# Patient Record
Sex: Female | Born: 1945
Health system: Southern US, Community
[De-identification: ages and names within clinical notes are randomized; demographics above are authoritative.]

## PROBLEM LIST (undated history)

## (undated) DIAGNOSIS — Z8639 Personal history of other endocrine, nutritional and metabolic disease: Secondary | ICD-10-CM

## (undated) DIAGNOSIS — M19049 Primary osteoarthritis, unspecified hand: Secondary | ICD-10-CM

## (undated) DIAGNOSIS — K5790 Diverticulosis of intestine, part unspecified, without perforation or abscess without bleeding: Secondary | ICD-10-CM

## (undated) DIAGNOSIS — Z90710 Acquired absence of both cervix and uterus: Secondary | ICD-10-CM

## (undated) DIAGNOSIS — E2839 Other primary ovarian failure: Secondary | ICD-10-CM

## (undated) DIAGNOSIS — Z8739 Personal history of other diseases of the musculoskeletal system and connective tissue: Secondary | ICD-10-CM

## (undated) DIAGNOSIS — L821 Other seborrheic keratosis: Secondary | ICD-10-CM

## (undated) DIAGNOSIS — F419 Anxiety disorder, unspecified: Secondary | ICD-10-CM

## (undated) DIAGNOSIS — I10 Essential (primary) hypertension: Secondary | ICD-10-CM

## (undated) DIAGNOSIS — D179 Benign lipomatous neoplasm, unspecified: Secondary | ICD-10-CM

## (undated) DIAGNOSIS — E559 Vitamin D deficiency, unspecified: Secondary | ICD-10-CM

## (undated) DIAGNOSIS — E785 Hyperlipidemia, unspecified: Secondary | ICD-10-CM

## (undated) DIAGNOSIS — J45909 Unspecified asthma, uncomplicated: Secondary | ICD-10-CM

## (undated) DIAGNOSIS — J309 Allergic rhinitis, unspecified: Secondary | ICD-10-CM

## (undated) DIAGNOSIS — R5383 Other fatigue: Secondary | ICD-10-CM

## (undated) HISTORY — DX: Other fatigue: R53.83

## (undated) HISTORY — DX: Personal history of other endocrine, nutritional and metabolic disease: Z86.39

## (undated) HISTORY — DX: Primary osteoarthritis, unspecified hand: M19.049

## (undated) HISTORY — DX: Unspecified asthma, uncomplicated: J45.909

## (undated) HISTORY — DX: Other seborrheic keratosis: L82.1

## (undated) HISTORY — DX: Vitamin D deficiency, unspecified: E55.9

## (undated) HISTORY — DX: Hyperlipidemia, unspecified: E78.5

## (undated) HISTORY — DX: Other primary ovarian failure: E28.39

## (undated) HISTORY — DX: Essential (primary) hypertension: I10

## (undated) HISTORY — DX: Personal history of other diseases of the musculoskeletal system and connective tissue: Z87.39

## (undated) HISTORY — DX: Diverticulosis of intestine, part unspecified, without perforation or abscess without bleeding: K57.90

## (undated) HISTORY — DX: Acquired absence of both cervix and uterus: Z90.710

## (undated) HISTORY — DX: Benign lipomatous neoplasm, unspecified: D17.9

## (undated) HISTORY — DX: Allergic rhinitis, unspecified: J30.9

## (undated) HISTORY — DX: Anxiety disorder, unspecified: F41.9

---

## 1977-07-26 HISTORY — PX: TOTAL ABDOMINAL HYSTERECTOMY: SHX209

## 2018-02-10 ENCOUNTER — Encounter: Payer: Self-pay | Admitting: *Deleted

## 2018-02-13 ENCOUNTER — Other Ambulatory Visit: Payer: Self-pay | Admitting: Cardiology

## 2018-02-13 ENCOUNTER — Encounter: Payer: Self-pay | Admitting: *Deleted

## 2018-02-13 ENCOUNTER — Telehealth: Payer: Self-pay | Admitting: Cardiology

## 2018-02-13 ENCOUNTER — Ambulatory Visit: Payer: Medicare Other | Admitting: Cardiology

## 2018-02-13 ENCOUNTER — Encounter: Payer: Self-pay | Admitting: Cardiology

## 2018-02-13 VITALS — BP 111/62 | HR 60 | Ht 63.5 in | Wt 140.6 lb

## 2018-02-13 DIAGNOSIS — I5022 Chronic systolic (congestive) heart failure: Secondary | ICD-10-CM

## 2018-02-13 DIAGNOSIS — I447 Left bundle-branch block, unspecified: Secondary | ICD-10-CM

## 2018-02-13 DIAGNOSIS — I10 Essential (primary) hypertension: Secondary | ICD-10-CM | POA: Insufficient documentation

## 2018-02-13 MED ORDER — SACUBITRIL-VALSARTAN 49-51 MG PO TABS: 1.0000 | ORAL_TABLET | Freq: Two times a day (BID) | ORAL | 3 refills | Status: DC

## 2018-02-13 MED ORDER — METOPROLOL SUCCINATE ER 25 MG PO TB24: 37.5000 mg | ORAL_TABLET | Freq: Every day | ORAL | 3 refills | Status: DC

## 2018-02-13 NOTE — H&P (View-Only) (Signed)
Clinical Summary Kathleen Edwards is a 72 y.o.female seen as new consult, referred by Dr Woody Seller for new onset systolic HF.    1. CHF - admitted 01/17/18 with new diagnosis of systolic HF.  - from notes presented with hypertensive emergency, started on nitro gtt and diuresed - EKG LBBB,  - 12/2017 echo [facility]: LVEF 20%  - compliant with meds - no recent edema - no recent chest pain, no SOB - home weight stable 140 lbs.       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 Acquired absence of both cervix and uterus   . Acute upper respiratory infection   . Anxiety disorder   . Asthma   . Degenerative joint disease of hand   . Diverticulosis   . Fatigue   . H/O: osteoarthritis   . History of vitamin A deficiency   . Hyperlipidemia   . Hypertension   . Keratosis, seborrheic   . Lipoma   . Ovarian failure   . Rhinitis, allergic   . Vitamin D deficiency      Allergies  Allergen Reactions  . Lisinopril     Cough   . Vioxx [Rofecoxib]     Epigastric discomfort      Current Outpatient Medications  Medication Sig Dispense Refill  . Calcium Carb-Cholecalciferol (CALCIUM 600 + D PO) Take by mouth.    . furosemide (LASIX) 20 MG tablet Take 20 mg by mouth daily.    Marland Kitchen losartan (COZAAR) 100 MG tablet Take 100 mg by mouth daily.    . metoprolol tartrate (LOPRESSOR) 25 MG tablet Take 25 mg by mouth 2 (two) times daily.    . potassium chloride (K-DUR) 10 MEQ tablet Take 10 mEq by mouth daily.    . pravastatin (PRAVACHOL) 40 MG tablet Take 40 mg by mouth daily.    . vitamin B-12 (CYANOCOBALAMIN) 1000 MCG tablet Take 1,000 mcg by mouth daily.     No current facility-administered medications for this visit.         Allergies  Allergen Reactions  . Lisinopril     Cough   . Vioxx [Rofecoxib]     Epigastric discomfort       Family History  Problem Relation Age of Onset  . CVA Mother   . Brain cancer Father       Social History Kathleen Edwards has no tobacco history on file. Kathleen Edwards has no alcohol history on file.   Review of Systems CONSTITUTIONAL: No weight loss, fever, chills, weakness or fatigue.  HEENT: Eyes: No visual loss, blurred vision, double vision or yellow sclerae.No hearing loss, sneezing, congestion, runny nose or sore throat.  SKIN: No rash or itching.  CARDIOVASCULAR: per hpi RESPIRATORY: per hpi.  GASTROINTESTINAL: No anorexia, nausea, vomiting or diarrhea. No abdominal pain or blood.  GENITOURINARY: No burning on urination, no polyuria NEUROLOGICAL: No headache, dizziness, syncope, paralysis, ataxia, numbness or tingling in the extremities. No change in bowel or bladder control.  MUSCULOSKELETAL: No muscle, back pain, joint pain or stiffness.  LYMPHATICS: No enlarged nodes. No history of splenectomy.  PSYCHIATRIC: No history of depression or anxiety.  ENDOCRINOLOGIC: No reports of sweating, cold or heat intolerance. No polyuria or polydipsia.  Marland Kitchen   Physical Examination Vitals:   02/13/18 0831 02/13/18 0837  BP: 111/68 111/62  Pulse: 78 60  SpO2: 98% 97%   Vitals:   02/13/18 0831  Weight: 140 lb  9.6 oz (63.8 kg)  Height: 5' 3.5" (1.613 m)    Gen: resting comfortably, no acute distress HEENT: no scleral icterus, pupils equal round and reactive, no palptable cervical adenopathy,  CV: RRR, no m/r/g, no jvd Resp: Clear to auscultation bilaterally GI: abdomen is soft, non-tender, non-distended, normal bowel sounds, no hepatosplenomegaly MSK: extremities are warm, no edema.  Skin: warm, no rash Neuro:  no focal deficits Psych: appropriate affect      Assessment and Plan  1 Chronic systolic HF - new diagnosis last month, LVEF 20% by echo at [facility]. NYHA II/III - we will change lopressor to TOprol XL 37.5mg  daily. Stop losartan, start entresto 49/51mg  bid - plan for RHC/LHC to evaluate for possible ischemic cardiomyopathy  2. LBBB - unknown chronicity -  in setting of systolic HF increased concern for ICM - f/u cath  I have reviewed the risks, indications, and alternatives to cardiac catheterization, possible angioplasty, and stenting with the patient and her daughter today. Risks include but are not limited to bleeding, infection, vascular injury, stroke, myocardial infection, arrhythmia, kidney injury, radiation-related injury in the case of prolonged fluoroscopy use, emergency cardiac surgery, and death. The patient understands the risks of serious complication is 1-2 in 3009 with diagnostic cardiac cath and 1-2% or less with angioplasty/stenting.   F/u 3 weeks    Arnoldo Lenis, M.D.

## 2018-02-13 NOTE — Patient Instructions (Addendum)
Your physician recommends that you schedule a follow-up appointment in: [place] has recommended you make the following change in your medication:   STOP LOPRESSOR   STOP LOSARTAN  TART TOPROL XL 37.5 MG (1 AND 1/2 TABLETS) DAILY  START ENTRESTO 48/51 MG TWICE DAILY  Your physician has requested that you have a cardiac catheterization. Cardiac catheterization is used to diagnose and/or treat various heart conditions. Doctors may recommend this procedure for a number of different reasons. The most common reason is to evaluate chest pain. Chest pain can be a symptom of coronary artery disease (CAD), and cardiac catheterization can show whether plaque is narrowing or blocking your heart's arteries. This procedure is also used to evaluate the valves, as well as measure the blood flow and oxygen levels in different parts of your heart. For further information please visit [email or web address]. Please follow instruction sheet, as given.  Thank you for choosing [facility]!!

## 2018-02-13 NOTE — Progress Notes (Signed)
Clinical Summary Kathleen Edwards is a 72 y.o.female seen as new consult, referred by Dr Woody Seller for new onset systolic HF.    1. CHF - admitted 01/17/18 with new diagnosis of systolic HF.  - from notes presented with hypertensive emergency, started on nitro gtt and diuresed - EKG LBBB,  - 12/2017 echo [facility]: LVEF 20%  - compliant with meds - no recent edema - no recent chest pain, no SOB - home weight stable 140 lbs.       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 Acquired absence of both cervix and uterus   . Acute upper respiratory infection   . Anxiety disorder   . Asthma   . Degenerative joint disease of hand   . Diverticulosis   . Fatigue   . H/O: osteoarthritis   . History of vitamin A deficiency   . Hyperlipidemia   . Hypertension   . Keratosis, seborrheic   . Lipoma   . Ovarian failure   . Rhinitis, allergic   . Vitamin D deficiency      Allergies  Allergen Reactions  . Lisinopril     Cough   . Vioxx [Rofecoxib]     Epigastric discomfort      Current Outpatient Medications  Medication Sig Dispense Refill  . Calcium Carb-Cholecalciferol (CALCIUM 600 + D PO) Take by mouth.    . furosemide (LASIX) 20 MG tablet Take 20 mg by mouth daily.    Marland Kitchen losartan (COZAAR) 100 MG tablet Take 100 mg by mouth daily.    . metoprolol tartrate (LOPRESSOR) 25 MG tablet Take 25 mg by mouth 2 (two) times daily.    . potassium chloride (K-DUR) 10 MEQ tablet Take 10 mEq by mouth daily.    . pravastatin (PRAVACHOL) 40 MG tablet Take 40 mg by mouth daily.    . vitamin B-12 (CYANOCOBALAMIN) 1000 MCG tablet Take 1,000 mcg by mouth daily.     No current facility-administered medications for this visit.         Allergies  Allergen Reactions  . Lisinopril     Cough   . Vioxx [Rofecoxib]     Epigastric discomfort       Family History  Problem Relation Age of Onset  . CVA Mother   . Brain cancer Father       Social History Ms. Pain has no tobacco history on file. Ms. Goya has no alcohol history on file.   Review of Systems CONSTITUTIONAL: No weight loss, fever, chills, weakness or fatigue.  HEENT: Eyes: No visual loss, blurred vision, double vision or yellow sclerae.No hearing loss, sneezing, congestion, runny nose or sore throat.  SKIN: No rash or itching.  CARDIOVASCULAR: per hpi RESPIRATORY: per hpi.  GASTROINTESTINAL: No anorexia, nausea, vomiting or diarrhea. No abdominal pain or blood.  GENITOURINARY: No burning on urination, no polyuria NEUROLOGICAL: No headache, dizziness, syncope, paralysis, ataxia, numbness or tingling in the extremities. No change in bowel or bladder control.  MUSCULOSKELETAL: No muscle, back pain, joint pain or stiffness.  LYMPHATICS: No enlarged nodes. No history of splenectomy.  PSYCHIATRIC: No history of depression or anxiety.  ENDOCRINOLOGIC: No reports of sweating, cold or heat intolerance. No polyuria or polydipsia.  Marland Kitchen   Physical Examination Vitals:   02/13/18 0831 02/13/18 0837  BP: 111/68 111/62  Pulse: 78 60  SpO2: 98% 97%   Vitals:   02/13/18 0831  Weight: 140 lb  9.6 oz (63.8 kg)  Height: 5' 3.5" (1.613 m)    Gen: resting comfortably, no acute distress HEENT: no scleral icterus, pupils equal round and reactive, no palptable cervical adenopathy,  CV: RRR, no m/r/g, no jvd Resp: Clear to auscultation bilaterally GI: abdomen is soft, non-tender, non-distended, normal bowel sounds, no hepatosplenomegaly MSK: extremities are warm, no edema.  Skin: warm, no rash Neuro:  no focal deficits Psych: appropriate affect      Assessment and Plan  1 Chronic systolic HF - new diagnosis last month, LVEF 20% by echo at [facility]. NYHA II/III - we will change lopressor to TOprol XL 37.5mg  daily. Stop losartan, start entresto 49/51mg  bid - plan for RHC/LHC to evaluate for possible ischemic cardiomyopathy  2. LBBB - unknown chronicity -  in setting of systolic HF increased concern for ICM - f/u cath  I have reviewed the risks, indications, and alternatives to cardiac catheterization, possible angioplasty, and stenting with the patient and her daughter today. Risks include but are not limited to bleeding, infection, vascular injury, stroke, myocardial infection, arrhythmia, kidney injury, radiation-related injury in the case of prolonged fluoroscopy use, emergency cardiac surgery, and death. The patient understands the risks of serious complication is 1-2 in 1610 with diagnostic cardiac cath and 1-2% or less with angioplasty/stenting.   F/u 3 weeks    Arnoldo Lenis, M.D.

## 2018-02-13 NOTE — Telephone Encounter (Signed)
Pre-cert Verification for the following procedure   CATH 7/24 @ 8:30 DR Claiborne Billings

## 2018-02-14 ENCOUNTER — Telehealth: Payer: Self-pay | Admitting: *Deleted

## 2018-02-14 ENCOUNTER — Encounter: Payer: Self-pay | Admitting: *Deleted

## 2018-02-14 NOTE — Telephone Encounter (Signed)
Pt contacted pre-catheterization scheduled at [facility] for: Wednesday February 15, 2018 8:30 AM Verified arrival time and place: [place] Entrance A at: 6:30 AM  No solid food after midnight prior to cath, clear liquids until 5 AM day of procedure. Verified allergies in Epic  Hold: HCTZ AM of procedure  KCL AM of procedure  Except hold medications AM meds can be  taken pre-cath with sip of water including: ASA 81 mg  Confirmed patient has responsible person to drive home post procedure and for 24 hours after you arrive home: yes

## 2018-02-15 ENCOUNTER — Encounter (HOSPITAL_COMMUNITY): Payer: Self-pay | Admitting: Cardiovascular Disease

## 2018-02-15 ENCOUNTER — Ambulatory Visit (HOSPITAL_COMMUNITY)
Admission: RE | Admit: 2018-02-15 | Discharge: 2018-02-15 | Disposition: A | Discharge status: Home / Self Care | Payer: Medicare Other | Source: Ambulatory Visit | Attending: Cardiovascular Disease | Admitting: Cardiovascular Disease

## 2018-02-15 ENCOUNTER — Encounter (HOSPITAL_COMMUNITY)
Admission: RE | Disposition: A | Discharge status: Home / Self Care | Payer: Self-pay | Source: Ambulatory Visit | Attending: Cardiovascular Disease

## 2018-02-15 DIAGNOSIS — J45909 Unspecified asthma, uncomplicated: Secondary | ICD-10-CM | POA: Insufficient documentation

## 2018-02-15 DIAGNOSIS — I110 Hypertensive heart disease with heart failure: Secondary | ICD-10-CM | POA: Diagnosis present

## 2018-02-15 DIAGNOSIS — I5022 Chronic systolic (congestive) heart failure: Secondary | ICD-10-CM | POA: Diagnosis not present

## 2018-02-15 DIAGNOSIS — E559 Vitamin D deficiency, unspecified: Secondary | ICD-10-CM | POA: Insufficient documentation

## 2018-02-15 DIAGNOSIS — I2510 Atherosclerotic heart disease of native coronary artery without angina pectoris: Secondary | ICD-10-CM

## 2018-02-15 DIAGNOSIS — E785 Hyperlipidemia, unspecified: Secondary | ICD-10-CM | POA: Insufficient documentation

## 2018-02-15 DIAGNOSIS — F419 Anxiety disorder, unspecified: Secondary | ICD-10-CM | POA: Insufficient documentation

## 2018-02-15 DIAGNOSIS — M19049 Primary osteoarthritis, unspecified hand: Secondary | ICD-10-CM | POA: Diagnosis not present

## 2018-02-15 DIAGNOSIS — I447 Left bundle-branch block, unspecified: Secondary | ICD-10-CM | POA: Diagnosis not present

## 2018-02-15 HISTORY — PX: RIGHT/LEFT HEART CATH AND CORONARY ANGIOGRAPHY: CATH118266

## 2018-02-15 LAB — POCT I-STAT 3, VENOUS BLOOD GAS (G3P V)
Acid-base deficit: 3 mmol/L — ABNORMAL HIGH (ref 0.0–2.0)
Acid-base deficit: 3 mmol/L — ABNORMAL HIGH (ref 0.0–2.0)
Bicarbonate: 22.2 mmol/L (ref 20.0–28.0)
Bicarbonate: 22.6 mmol/L (ref 20.0–28.0)
O2 Saturation: 67 %
O2 Saturation: 67 %
TCO2: 23 mmol/L (ref 22–32)
TCO2: 24 mmol/L (ref 22–32)
pCO2, Ven: 40.3 mmHg — ABNORMAL LOW (ref 44.0–60.0)
pCO2, Ven: 40.6 mmHg — ABNORMAL LOW (ref 44.0–60.0)
pH, Ven: 7.349 (ref 7.250–7.430)
pH, Ven: 7.353 (ref 7.250–7.430)
pO2, Ven: 36 mmHg (ref 32.0–45.0)
pO2, Ven: 36 mmHg (ref 32.0–45.0)

## 2018-02-15 LAB — POCT I-STAT 3, ART BLOOD GAS (G3+)
Acid-base deficit: 6 mmol/L — ABNORMAL HIGH (ref 0.0–2.0)
Bicarbonate: 19.9 mmol/L — ABNORMAL LOW (ref 20.0–28.0)
O2 Saturation: 92 %
TCO2: 21 mmol/L — ABNORMAL LOW (ref 22–32)
pCO2 arterial: 39.3 mmHg (ref 32.0–48.0)
pH, Arterial: 7.312 — ABNORMAL LOW (ref 7.350–7.450)
pO2, Arterial: 70 mmHg — ABNORMAL LOW (ref 83.0–108.0)

## 2018-02-15 SURGERY — RIGHT/LEFT HEART CATH AND CORONARY ANGIOGRAPHY
Anesthesia: LOCAL

## 2018-02-15 MED ORDER — HEPARIN SODIUM (PORCINE) 1000 UNIT/ML IJ SOLN
INTRAMUSCULAR | Status: DC | PRN
  Administered 2018-02-15: 3000 [IU] via INTRAVENOUS

## 2018-02-15 MED ORDER — FENTANYL CITRATE (PF) 100 MCG/2ML IJ SOLN
INTRAMUSCULAR | Status: DC | PRN
  Administered 2018-02-15: 25 ug via INTRAVENOUS

## 2018-02-15 MED ORDER — ASPIRIN 81 MG PO CHEW: 81.0000 mg | CHEWABLE_TABLET | ORAL | Status: DC

## 2018-02-15 MED ORDER — HEPARIN (PORCINE) IN NACL 2000-0.9 UNIT/L-% IV SOLN
INTRAVENOUS | Status: AC
  Filled 2018-02-15: qty 1000

## 2018-02-15 MED ORDER — FENTANYL CITRATE (PF) 100 MCG/2ML IJ SOLN
INTRAMUSCULAR | Status: AC
  Filled 2018-02-15: qty 2

## 2018-02-15 MED ORDER — IOPAMIDOL (ISOVUE-370) INJECTION 76%
INTRAVENOUS | Status: AC
  Filled 2018-02-15: qty 100

## 2018-02-15 MED ORDER — LIDOCAINE HCL (PF) 1 % IJ SOLN
INTRAMUSCULAR | Status: DC | PRN
  Administered 2018-02-15: 3 mL
  Administered 2018-02-15: 5 mL

## 2018-02-15 MED ORDER — HEPARIN SODIUM (PORCINE) 1000 UNIT/ML IJ SOLN
INTRAMUSCULAR | Status: AC
  Filled 2018-02-15: qty 1

## 2018-02-15 MED ORDER — SODIUM CHLORIDE 0.9% FLUSH: 3.0000 mL | INTRAVENOUS | Status: DC | PRN

## 2018-02-15 MED ORDER — SODIUM CHLORIDE 0.9 % IV SOLN
INTRAVENOUS | Status: DC
  Administered 2018-02-15: 07:00:00 via INTRAVENOUS

## 2018-02-15 MED ORDER — MIDAZOLAM HCL 2 MG/2ML IJ SOLN
INTRAMUSCULAR | Status: DC | PRN
  Administered 2018-02-15: 1 mg via INTRAVENOUS

## 2018-02-15 MED ORDER — VERAPAMIL HCL 2.5 MG/ML IV SOLN
INTRAVENOUS | Status: AC
  Filled 2018-02-15: qty 2

## 2018-02-15 MED ORDER — HEPARIN (PORCINE) IN NACL 2000-0.9 UNIT/L-% IV SOLN
INTRAVENOUS | Status: DC | PRN
  Administered 2018-02-15: 1000 mL

## 2018-02-15 MED ORDER — SODIUM CHLORIDE 0.9 % IV SOLN: 250.0000 mL | INTRAVENOUS | Status: DC | PRN

## 2018-02-15 MED ORDER — MIDAZOLAM HCL 2 MG/2ML IJ SOLN
INTRAMUSCULAR | Status: AC
  Filled 2018-02-15: qty 2

## 2018-02-15 MED ORDER — VERAPAMIL HCL 2.5 MG/ML IV SOLN
INTRAVENOUS | Status: DC | PRN
  Administered 2018-02-15: 10 mL via INTRA_ARTERIAL

## 2018-02-15 MED ORDER — LIDOCAINE HCL (PF) 1 % IJ SOLN
INTRAMUSCULAR | Status: AC
  Filled 2018-02-15: qty 30

## 2018-02-15 MED ORDER — SODIUM CHLORIDE 0.9% FLUSH: 3.0000 mL | Freq: Two times a day (BID) | INTRAVENOUS | Status: DC

## 2018-02-15 MED ORDER — IOPAMIDOL (ISOVUE-370) INJECTION 76%
INTRAVENOUS | Status: DC | PRN
  Administered 2018-02-15: 70 mL via INTRAVENOUS

## 2018-02-15 SURGICAL SUPPLY — 14 items
CATH 5FR JL3.5 JR4 ANG PIG MP (CATHETERS) IMPLANT
CATH BALLN WEDGE 5F 110CM (CATHETERS) ×2 IMPLANT
CATH OPTITORQUE TIG 4.0 5F (CATHETERS) ×2 IMPLANT
DEVICE RAD COMP TR BAND LRG (VASCULAR PRODUCTS) ×2 IMPLANT
GLIDESHEATH SLEND SS 6F .021 (SHEATH) ×2 IMPLANT
GUIDEWIRE .025 260CM (WIRE) ×2 IMPLANT
GUIDEWIRE INQWIRE 1.5J.035X260 (WIRE) ×1 IMPLANT
INQWIRE 1.5J .035X260CM (WIRE) ×2
KIT HEART LEFT (KITS) ×2 IMPLANT
PACK CARDIAC CATHETERIZATION (CUSTOM PROCEDURE TRAY) ×2 IMPLANT
SHEATH GLIDE SLENDER 4/5FR (SHEATH) ×2 IMPLANT
SHEATH PROBE COVER 6X72 (BAG) ×2 IMPLANT
TRANSDUCER W/STOPCOCK (MISCELLANEOUS) ×2 IMPLANT
TUBING CIL FLEX 10 FLL-RA (TUBING) ×2 IMPLANT

## 2018-02-15 NOTE — Discharge Instructions (Signed)
**Note Avanthika Dehnert-identified via Obfuscation** Radial Site Care °Refer to this sheet in the next few weeks. These instructions provide you with information about caring for yourself after your procedure. Your health care provider may also give you more specific instructions. Your treatment has been planned according to current medical practices, but problems sometimes occur. Call your health care provider if you have any problems or questions after your procedure. °What can I expect after the procedure? °After your procedure, it is typical to have the following: °· Bruising at the radial site that usually fades within 1-2 weeks. °· Blood collecting in the tissue (hematoma) that may be painful to the touch. It should usually decrease in size and tenderness within 1-2 weeks. ° °Follow these instructions at home: °· Take medicines only as directed by your health care provider. °· You may shower 24-48 hours after the procedure or as directed by your health care provider. Remove the bandage (dressing) and gently wash the site with plain soap and water. Pat the area dry with a clean towel. Do not rub the site, because this may cause bleeding. °· Do not take baths, swim, or use a hot tub until your health care provider approves. °· Check your insertion site every day for redness, swelling, or drainage. °· Do not apply powder or lotion to the site. °· Do not flex or bend the affected arm for 24 hours or as directed by your health care provider. °· Do not push or pull heavy objects with the affected arm for 24 hours or as directed by your health care provider. °· Do not lift over 10 lb (4.5 kg) for 5 days after your procedure or as directed by your health care provider. °· Ask your health care provider when it is okay to: °? Return to work or school. °? Resume usual physical activities or sports. °? Resume sexual activity. °· Do not drive home if you are discharged the same day as the procedure. Have someone else drive you. °· You may drive 24 hours after the procedure  unless otherwise instructed by your health care provider. °· Do not operate machinery or power tools for 24 hours after the procedure. °· If your procedure was done as an outpatient procedure, which means that you went home the same day as your procedure, a responsible adult should be with you for the first 24 hours after you arrive home. °· Keep all follow-up visits as directed by your health care provider. This is important. °Contact a health care provider if: °· You have a fever. °· You have chills. °· You have increased bleeding from the radial site. Hold pressure on the site. °Get help right away if: °· You have unusual pain at the radial site. °· You have redness, warmth, or swelling at the radial site. °· You have drainage (other than a small amount of blood on the dressing) from the radial site. °· The radial site is bleeding, and the bleeding does not stop after 30 minutes of holding steady pressure on the site. °· Your arm or hand becomes pale, cool, tingly, or numb. °This information is not intended to replace advice given to you by your health care provider. Make sure you discuss any questions you have with your health care provider. °Document Released: 08/14/2010 Document Revised: 12/18/2015 Document Reviewed: 01/28/2014 °Elsevier Interactive Patient Education © 2018 [employer] Inc[employer] ° °

## 2018-02-15 NOTE — Interval H&P Note (Signed)
Cath Lab Visit (complete for each Cath Lab visit)  Clinical Evaluation Leading to the Procedure:   ACS: No.  Non-ACS:    Anginal Classification: CCS III  Anti-ischemic medical therapy: Minimal Therapy (1 class of medications)  Non-Invasive Test Results: No non-invasive testing performed  Prior CABG: No previous CABG      History and Physical Interval Note:  02/15/2018 8:33 AM  Kathleen Edwards  has presented today for surgery, with the diagnosis of hf  The various methods of treatment have been discussed with the patient and family. After consideration of risks, benefits and other options for treatment, the patient has consented to  Procedure(s): RIGHT/LEFT HEART CATH AND CORONARY ANGIOGRAPHY (N/A) as a surgical intervention .  The patient's history has been reviewed, patient examined, no change in status, stable for surgery.  I have reviewed the patient's chart and labs.  Questions were answered to the patient's satisfaction.     Shelva Majestic

## 2018-02-16 ENCOUNTER — Telehealth: Payer: Self-pay | Admitting: Cardiology

## 2018-02-16 NOTE — Telephone Encounter (Signed)
Pt was d/c yesterday from [facility] cath - will forward to provider on when pt can return to work

## 2018-02-16 NOTE — Telephone Encounter (Signed)
Ok to provide note clearing her to return to work no restriction   Zandra Abts MD

## 2018-02-16 NOTE — Telephone Encounter (Signed)
LMTCB

## 2018-02-16 NOTE — Telephone Encounter (Signed)
patient walk in   Would like to back to asap.She said she is ready to get back.    Will come by and pick up note stating she can start back to work.

## 2018-02-16 NOTE — Telephone Encounter (Signed)
Pt will come by office to pick up letter

## 2018-03-09 ENCOUNTER — Ambulatory Visit: Payer: Medicare Other | Admitting: Cardiology

## 2018-03-09 ENCOUNTER — Other Ambulatory Visit: Payer: Self-pay

## 2018-03-09 ENCOUNTER — Encounter: Payer: Self-pay | Admitting: Cardiology

## 2018-03-09 VITALS — BP 124/66 | HR 60 | Ht 63.0 in | Wt 138.0 lb

## 2018-03-09 DIAGNOSIS — I2510 Atherosclerotic heart disease of native coronary artery without angina pectoris: Secondary | ICD-10-CM

## 2018-03-09 DIAGNOSIS — I5022 Chronic systolic (congestive) heart failure: Secondary | ICD-10-CM | POA: Diagnosis not present

## 2018-03-09 MED ORDER — SACUBITRIL-VALSARTAN 97-103 MG PO TABS: 1.0000 | ORAL_TABLET | Freq: Two times a day (BID) | ORAL | 3 refills | Status: DC

## 2018-03-09 NOTE — Patient Instructions (Addendum)
Your physician recommends that you schedule a follow-up appointment in: [place] has recommended you make the following change in your medication:   INCREASE ENTRESTO 97/103 MG TWICE DAILY   START ASPIRIN 31 MG DAILY  Your physician recommends that you return for lab work in: 2 WEEKS BMP/TSH/MG  Thank you for choosing [facility]!!

## 2018-03-09 NOTE — Progress Notes (Signed)
Clinical Summary Ms. Boissonneault is a 72 y.o.female seen today for follow up of the following medical problems.   1. Chronic systolic HF - admitted 8/85/02 with new diagnosis of systolic HF.  - from notes presented with hypertensive emergency, started on nitro gtt and diuresed - EKG LBBB,  - 12/2017 echo [facility]: LVEF 20% - 12/2017 cath with mild to moderate CAD. CI 3.2, PCWP 10, LVEDP 14. Overall normal CO and filling pressures  - no recent SOB/DOE. Back to work without troubles - compliant with meds  2. CAD - 12/2017 cath as reported below, overall mild to moderate disease - no recent chest pain   SH: works as housekeeping at [employer]     Past Medical History:  Diagnosis Date  . Acquired absence of both cervix and uterus   . Acute upper respiratory infection   . Anxiety disorder   . Asthma   . Degenerative joint disease of hand   . Diverticulosis   . Fatigue   . H/O: osteoarthritis   . History of vitamin A deficiency   . Hyperlipidemia   . Hypertension   . Keratosis, seborrheic   . Lipoma   . Ovarian failure   . Rhinitis, allergic   . Vitamin D deficiency      Allergies  Allergen Reactions  . Lisinopril Cough  . Vioxx [Rofecoxib] Other (See Comments)    Epigastric discomfort      Current Outpatient Medications  Medication Sig Dispense Refill  . Cholecalciferol (VITAMIN D PO) Take 1 capsule by mouth daily.    . hydrochlorothiazide (HYDRODIURIL) 12.5 MG tablet Take 12.5 mg by mouth daily.  4  . metoprolol succinate (TOPROL XL) 25 MG 24 hr tablet Take 1.5 tablets (37.5 mg total) by mouth daily. 45 tablet 3  . potassium chloride (K-DUR) 10 MEQ tablet Take 10 mEq by mouth daily.    . pravastatin (PRAVACHOL) 40 MG tablet Take 40 mg by mouth daily.    . sacubitril-valsartan (ENTRESTO) 49-51 MG Take 1 tablet by mouth 2 (two) times daily. 60 tablet 3   No current facility-administered medications for this visit.      Past Surgical History:    Procedure Laterality Date  . RIGHT/LEFT HEART CATH AND CORONARY ANGIOGRAPHY N/A 02/15/2018   Procedure: RIGHT/LEFT HEART CATH AND CORONARY ANGIOGRAPHY;  Surgeon: Troy Sine, MD;  Location: [facility];  Service: Cardiovascular;  Laterality: N/A;  . TOTAL ABDOMINAL HYSTERECTOMY  1979     Allergies  Allergen Reactions  . Lisinopril Cough  . Vioxx [Rofecoxib] Other (See Comments)    Epigastric discomfort       Family History  Problem Relation Age of Onset  . CVA Mother   . Brain cancer Father      Social History Ms. Labrador reports that she has quit smoking. She has never used smokeless tobacco. Ms. Caracci has no alcohol history on file.   Review of Systems CONSTITUTIONAL: No weight loss, fever, chills, weakness or fatigue.  HEENT: Eyes: No visual loss, blurred vision, double vision or yellow sclerae.No hearing loss, sneezing, congestion, runny nose or sore throat.  SKIN: No rash or itching.  CARDIOVASCULAR: per hpi RESPIRATORY: No shortness of breath, cough or sputum.  GASTROINTESTINAL: No anorexia, nausea, vomiting or diarrhea. No abdominal pain or blood.  GENITOURINARY: No burning on urination, no polyuria NEUROLOGICAL: No headache, dizziness, syncope, paralysis, ataxia, numbness or tingling in the extremities. No change in bowel or bladder control.  MUSCULOSKELETAL:  No muscle, back pain, joint pain or stiffness.  LYMPHATICS: No enlarged nodes. No history of splenectomy.  PSYCHIATRIC: No history of depression or anxiety.  ENDOCRINOLOGIC: No reports of sweating, cold or heat intolerance. No polyuria or polydipsia.  Marland Kitchen   Physical Examination Vitals:   03/09/18 1106  BP: 124/66  Pulse: 60  SpO2: 98%   Filed Weights   03/09/18 1106  Weight: 138 lb (62.6 kg)    Gen: resting comfortably, no acute distress HEENT: no scleral icterus, pupils equal round and reactive, no palptable cervical adenopathy,  CV: RRR, no m/r/g, no jvd Resp: Clear to auscultation  bilaterally GI: abdomen is soft, non-tender, non-distended, normal bowel sounds, no hepatosplenomegaly MSK: extremities are warm, no edema.  Skin: warm, no rash Neuro:  no focal deficits Psych: appropriate affect   Diagnostic Studies 12/2017 cath  Dist RCA lesion is 55% stenosed.  Mid LM to Dist LM lesion is 20% stenosed.  Ost Cx lesion is 20% stenosed.  Prox LAD lesion is 30% stenosed.  Prox RCA lesion is 20% stenosed.  Mid RCA lesion is 30% stenosed.   There is evidence for diffuse coronary calcification involving all coronary arteries.  Left main stenosis with distal 20% narrowing; proximal irregularity of the LAD with 30% proximal stenosis; 20% ostial stenosis of the left circumflex vessel; and very large dominant RCA with mild to moderate calcification and irregularity with 20% proximal 30% mid and 50 to 60% smooth stenosis immediately proximal to the takeoff of a large PDA and PLA vessel with 20% PLA narrowing.  RECOMMENDATION: Guideline directed medical therapy for the patient's cardiomyopathy which most likely is of nonischemic etiology.    Assessment and Plan  1 Chronic systolic HF - new diagnosis last month, LVEF 20% by echo at [facility]. NYHA II/III - recent cath with mild to mod CAD. Normal CO and filling pressures - increase entresto to 97/103mg  bid, check BMET/Mg/TSH in 2 weeks. [place] visit in 2 weeks, if stable vitals and labs start aldactone at that time. Heart rates at 60, hold on beta blocker titration at this time.   2. CAD - mild to moderate CAD by cath - start ASA 81mg  daily. Continue ARB and statin.       Arnoldo Lenis, M.D

## 2018-03-23 ENCOUNTER — Encounter: Payer: Self-pay | Admitting: *Deleted

## 2018-03-23 ENCOUNTER — Ambulatory Visit (INDEPENDENT_AMBULATORY_CARE_PROVIDER_SITE_OTHER): Payer: Medicare Other | Admitting: *Deleted

## 2018-03-23 DIAGNOSIS — I5022 Chronic systolic (congestive) heart failure: Secondary | ICD-10-CM

## 2018-03-23 NOTE — Progress Notes (Signed)
BP on low end of normal and accetpable, please verify she is not having any lightheadedness or dizziness. No med changes at this time   J BranchMD

## 2018-03-23 NOTE — Progress Notes (Signed)
Pt here for vitals check - says she had labs done at [facility] last week (will request) pt says feet have been swelling but says she thinks it may be from standing on them for long periods of time - BP today is 104/62 HR 51

## 2018-03-23 NOTE — Progress Notes (Signed)
LM to return call.

## 2018-03-24 ENCOUNTER — Telehealth: Payer: Self-pay | Admitting: Cardiology

## 2018-03-24 NOTE — Telephone Encounter (Signed)
Patient informed and says that she has been off K+ for over a month under advice from Dr. Woody Seller. Patient said that she will restart the K+ 10 meq daily.

## 2018-03-24 NOTE — Telephone Encounter (Signed)
-----   Message from Arnoldo Lenis, MD sent at 03/23/2018 11:12 AM EDT ----- Potassium is a little low, verify she is taking KCl 34mEq daily, if so increase to 62mEq daily  Zandra Abts MD

## 2018-03-24 NOTE — Telephone Encounter (Signed)
Patient called stating that she is returning a call

## 2018-03-28 NOTE — Progress Notes (Signed)
Pt denies any lightheadedness or dizziness - denies any symptoms at this time

## 2018-04-21 ENCOUNTER — Encounter: Payer: Self-pay | Admitting: Cardiology

## 2018-04-21 ENCOUNTER — Encounter: Payer: Self-pay | Admitting: *Deleted

## 2018-04-21 ENCOUNTER — Ambulatory Visit: Payer: Medicare Other | Admitting: Cardiology

## 2018-04-21 VITALS — BP 104/60 | HR 63 | Ht 63.0 in | Wt 143.6 lb

## 2018-04-21 DIAGNOSIS — I5022 Chronic systolic (congestive) heart failure: Secondary | ICD-10-CM

## 2018-04-21 DIAGNOSIS — I2510 Atherosclerotic heart disease of native coronary artery without angina pectoris: Secondary | ICD-10-CM

## 2018-04-21 NOTE — Patient Instructions (Signed)
Your physician recommends that you schedule a follow-up appointment in: 3 MONTHS WITH DR BRANCH  Your physician recommends that you continue on your current medications as directed. Please refer to the Current Medication list given to you today.  Thank you for choosing [facility]!!    

## 2018-04-21 NOTE — Progress Notes (Signed)
Clinical Summary Ms. Sandberg is a 72 y.o.female  seen today for follow up of the following medical problems.   1. Chronic systolic HF - admitted 6/78/93 withnew diagnosis of systolic HF. - from notes presented with hypertensive emergency, started on nitro gtt and diuresed - EKG LBBB,  - 12/2017 echo [facility]: LVEF 20% - 12/2017 cath with mild to moderate CAD. CI 3.2, PCWP 10, LVEDP 14. Overall normal CO and filling pressures   - last visit increased entresto to 97/103mg  bid. Soft bp's at nursing f/u, no further med changes made  - admit last week with low bp's and tachycardia to [facility] - Toprl was lowered to 25mg , entresto was continued. No recurrent issues.  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     2. CAD - 12/2017 cath as reported below, overall mild to moderate disease - denies any recent chest pani   SH: works as IT trainer   Past Medical History:  Diagnosis Date  . Acquired absence of both cervix and uterus   . Acute upper respiratory infection   . Anxiety disorder   . Asthma   . Degenerative joint disease of hand   . Diverticulosis   . Fatigue   . H/O: osteoarthritis   . History of vitamin A deficiency   . Hyperlipidemia   . Hypertension   . Keratosis, seborrheic   . Lipoma   . Ovarian failure   . Rhinitis, allergic   . Vitamin D deficiency      Allergies  Allergen Reactions  . Lisinopril Cough  . Vioxx [Rofecoxib] Other (See Comments)    Epigastric discomfort      Current Outpatient Medications  Medication Sig Dispense Refill  . aspirin EC 81 MG tablet Take 81 mg by mouth daily.    . Cholecalciferol (VITAMIN D PO) Take 1 capsule by mouth daily.    . hydrochlorothiazide (HYDRODIURIL) 12.5 MG tablet Take 12.5 mg by mouth daily.  4  . metoprolol succinate (TOPROL XL) 25 MG 24 hr tablet Take 1.5 tablets (37.5 mg total) by mouth daily. 45 tablet 3  . potassium chloride  (K-DUR) 10 MEQ tablet Take 10 mEq by mouth daily.    . pravastatin (PRAVACHOL) 40 MG tablet Take 40 mg by mouth daily.    . sacubitril-valsartan (ENTRESTO) 97-103 MG Take 1 tablet by mouth 2 (two) times daily. 60 tablet 3   No current facility-administered medications for this visit.      Past Surgical History:  Procedure Laterality Date  . RIGHT/LEFT HEART CATH AND CORONARY ANGIOGRAPHY N/A 02/15/2018   Procedure: RIGHT/LEFT HEART CATH AND CORONARY ANGIOGRAPHY;  Surgeon: Troy Sine, MD;  Location: [facility];  Service: Cardiovascular;  Laterality: N/A;  . TOTAL ABDOMINAL HYSTERECTOMY  1979     Allergies  Allergen Reactions  . Lisinopril Cough  . Vioxx [Rofecoxib] Other (See Comments)    Epigastric discomfort       Family History  Problem Relation Age of Onset  . CVA Mother   . Brain cancer Father      Social History Ms. Santarelli reports that she has quit smoking. She has never used smokeless tobacco. Ms. Stlouis has no alcohol history on file.   Review of Systems CONSTITUTIONAL: No weight loss, fever, chills, weakness or fatigue.  HEENT: Eyes: No visual loss, blurred vision, double vision or yellow sclerae.No hearing loss, sneezing, congestion, runny nose or sore throat.  SKIN:  No rash or itching.  CARDIOVASCULAR: per hpi RESPIRATORY: No shortness of breath, cough or sputum.  GASTROINTESTINAL: No anorexia, nausea, vomiting or diarrhea. No abdominal pain or blood.  GENITOURINARY: No burning on urination, no polyuria NEUROLOGICAL: No headache, dizziness, syncope, paralysis, ataxia, numbness or tingling in the extremities. No change in bowel or bladder control.  MUSCULOSKELETAL: No muscle, back pain, joint pain or stiffness.  LYMPHATICS: No enlarged nodes. No history of splenectomy.  PSYCHIATRIC: No history of depression or anxiety.  ENDOCRINOLOGIC: No reports of sweating, cold or heat intolerance. No polyuria or polydipsia.  Marland Kitchen   Physical  Examination Vitals:   04/21/18 1445  BP: 104/60  Pulse: 63  SpO2: 97%   Vitals:   04/21/18 1445  Weight: 143 lb 9.6 oz (65.1 kg)  Height: 5\' 3"  (1.6 m)    Gen: resting comfortably, no acute distress HEENT: no scleral icterus, pupils equal round and reactive, no palptable cervical adenopathy,  CV: RRR, no m/r/g, no jvd Resp: Clear to auscultation bilaterally GI: abdomen is soft, non-tender, non-distended, normal bowel sounds, no hepatosplenomegaly MSK: extremities are warm, no edema.  Skin: warm, no rash Neuro:  no focal deficits Psych: appropriate affect   Diagnostic Studies 12/2017 cath  Dist RCA lesion is 55% stenosed.  Mid LM to Dist LM lesion is 20% stenosed.  Ost Cx lesion is 20% stenosed.  Prox LAD lesion is 30% stenosed.  Prox RCA lesion is 20% stenosed.  Mid RCA lesion is 30% stenosed.  There is evidence for diffuse coronary calcification involving all coronary arteries.  Left main stenosis with distal 20% narrowing; proximal irregularity of the LAD with 30% proximal stenosis; 20% ostial stenosis of the left circumflex vessel; and very large dominant RCA with mild to moderate calcification and irregularity with 20% proximal 30% mid and 50 to 60% smooth stenosis immediately proximal to the takeoff of a large PDA and PLA vessel with 20% PLA narrowing.  RECOMMENDATION: Guideline directed medical therapy for the patient's cardiomyopathy which most likely is of nonischemic etiology.     Assessment and Plan  1 Chronic systolic HF - new diagnosis last month, LVEF 20% by echo at [facility]. NYHA II/III - recent cath with mild to mod CAD. Normal CO and filling pressures -she is on her maximally titrated CHF regimen, recent admission for symptomatic hypotension where her TOprol was lowered. Doing well since that time. Request those records from [facility] - continue current meds, repeat echo at next f/u in 3 months  2. CAD - mild to moderate CAD by cath - no  symptoms, continue curernt meds   F/u 3 months. Likely repeat echo at that visit.    Arnoldo Lenis, M.D.

## 2018-07-21 ENCOUNTER — Encounter: Payer: Self-pay | Admitting: *Deleted

## 2018-07-21 ENCOUNTER — Encounter: Payer: Self-pay | Admitting: Cardiology

## 2018-07-21 ENCOUNTER — Telehealth: Payer: Self-pay | Admitting: Cardiology

## 2018-07-21 ENCOUNTER — Ambulatory Visit: Payer: Medicare Other | Admitting: Cardiology

## 2018-07-21 VITALS — BP 124/64 | HR 64 | Ht 63.5 in | Wt 145.0 lb

## 2018-07-21 DIAGNOSIS — I2510 Atherosclerotic heart disease of native coronary artery without angina pectoris: Secondary | ICD-10-CM | POA: Diagnosis not present

## 2018-07-21 DIAGNOSIS — I5022 Chronic systolic (congestive) heart failure: Secondary | ICD-10-CM

## 2018-07-21 MED ORDER — FUROSEMIDE 20 MG PO TABS: 20.0000 mg | ORAL_TABLET | Freq: Every day | ORAL | 3 refills | Status: DC

## 2018-07-21 NOTE — Telephone Encounter (Signed)
°  Precert needed for: Echo - chronic systolic heart failure   Location: [facility]    Date: Aug 02, 2018

## 2018-07-21 NOTE — Telephone Encounter (Signed)
Appointment changed to Jul 27, 2018

## 2018-07-21 NOTE — Patient Instructions (Signed)

## 2018-07-21 NOTE — Progress Notes (Signed)
Clinical Summary Kathleen Edwards is a 72 y.o.female seen today for follow up of the following medical problems.  1.Chronic systolic HF - admitted 2/77/82 withnew diagnosis of systolic HF. - from notes presented with hypertensive emergency, started on nitro gtt and diuresed - EKG LBBB,  - 12/2017 echo [facility]: LVEF 20% - 12/2017 cath with mild to moderate CAD. CI 3.2, PCWP 10, LVEDP 14. Overall normal CO and filling pressures    - admit to [facility] recently with low bp's and tachycardia to [facility] - Toprl was lowered to 25mg , entresto was continued.   -No SOB/DOE, no recent edema - compliant with meds.  - stable home weights.    2. CAD - 12/2017 cath as reported below, overall mild to moderate disease - no recent symptoms   SH: works as IT trainer   Past Medical History:  Diagnosis Date  . Acquired absence of both cervix and uterus   . Acute upper respiratory infection   . Anxiety disorder   . Asthma   . Degenerative joint disease of hand   . Diverticulosis   . Fatigue   . H/O: osteoarthritis   . History of vitamin A deficiency   . Hyperlipidemia   . Hypertension   . Keratosis, seborrheic   . Lipoma   . Ovarian failure   . Rhinitis, allergic   . Vitamin D deficiency      Allergies  Allergen Reactions  . Lisinopril Cough  . Vioxx [Rofecoxib] Other (See Comments)    Epigastric discomfort      Current Outpatient Medications  Medication Sig Dispense Refill  . aspirin EC 81 MG tablet Take 81 mg by mouth daily.    . Cholecalciferol (VITAMIN D PO) Take 1 capsule by mouth daily.    . furosemide (LASIX) 20 MG tablet Take 20 mg by mouth daily.  3  . metoprolol succinate (TOPROL-XL) 25 MG 24 hr tablet Take 25 mg by mouth daily.    . potassium chloride (K-DUR) 10 MEQ tablet Take 10 mEq by mouth daily.    . pravastatin (PRAVACHOL) 40 MG tablet Take 40 mg by mouth daily.    . sacubitril-valsartan (ENTRESTO) 97-103 MG Take 1  tablet by mouth 2 (two) times daily. 60 tablet 3   No current facility-administered medications for this visit.      Past Surgical History:  Procedure Laterality Date  . RIGHT/LEFT HEART CATH AND CORONARY ANGIOGRAPHY N/A 02/15/2018   Procedure: RIGHT/LEFT HEART CATH AND CORONARY ANGIOGRAPHY;  Surgeon: Troy Sine, MD;  Location: [facility];  Service: Cardiovascular;  Laterality: N/A;  . TOTAL ABDOMINAL HYSTERECTOMY  1979     Allergies  Allergen Reactions  . Lisinopril Cough  . Vioxx [Rofecoxib] Other (See Comments)    Epigastric discomfort       Family History  Problem Relation Age of Onset  . CVA Mother   . Brain cancer Father      Social History Kathleen Edwards reports that she has quit smoking. She has never used smokeless tobacco. Kathleen Edwards has no history on file for alcohol.   Review of Systems CONSTITUTIONAL: No weight loss, fever, chills, weakness or fatigue.  HEENT: Eyes: No visual loss, blurred vision, double vision or yellow sclerae.No hearing loss, sneezing, congestion, runny nose or sore throat.  SKIN: No rash or itching.  CARDIOVASCULAR: per hpi RESPIRATORY: No shortness of breath, cough or sputum.  GASTROINTESTINAL: No anorexia, nausea, vomiting or diarrhea. No abdominal pain  or blood.  GENITOURINARY: No burning on urination, no polyuria NEUROLOGICAL: No headache, dizziness, syncope, paralysis, ataxia, numbness or tingling in the extremities. No change in bowel or bladder control.  MUSCULOSKELETAL: No muscle, back pain, joint pain or stiffness.  LYMPHATICS: No enlarged nodes. No history of splenectomy.  PSYCHIATRIC: No history of depression or anxiety.  ENDOCRINOLOGIC: No reports of sweating, cold or heat intolerance. No polyuria or polydipsia.  Marland Kitchen   Physical Examination Vitals:   07/21/18 0807  BP: 124/64  Pulse: 64  SpO2: 99%   Vitals:   07/21/18 0807  Weight: 145 lb (65.8 kg)  Height: 5' 3.5" (1.613 m)    Gen: resting comfortably,  no acute distress HEENT: no scleral icterus, pupils equal round and reactive, no palptable cervical adenopathy,  CV: RRR, no m/rg, no jvd Resp: Clear to auscultation bilaterally GI: abdomen is soft, non-tender, non-distended, normal bowel sounds, no hepatosplenomegaly MSK: extremities are warm, no edema.  Skin: warm, no rash Neuro:  no focal deficits Psych: appropriate affect   Diagnostic Studies 12/2017 cath  Dist RCA lesion is 55% stenosed.  Mid LM to Dist LM lesion is 20% stenosed.  Ost Cx lesion is 20% stenosed.  Prox LAD lesion is 30% stenosed.  Prox RCA lesion is 20% stenosed.  Mid RCA lesion is 30% stenosed.  There is evidence for diffuse coronary calcification involving all coronary arteries.  Left main stenosis with distal 20% narrowing; proximal irregularity of the LAD with 30% proximal stenosis; 20% ostial stenosis of the left circumflex vessel; and very large dominant RCA with mild to moderate calcification and irregularity with 20% proximal 30% mid and 50 to 60% smooth stenosis immediately proximal to the takeoff of a large PDA and PLA vessel with 20% PLA narrowing.  RECOMMENDATION: Guideline directed medical therapy for the patient's cardiomyopathy which most likely is of nonischemic etiology.    Assessment and Plan  1 Chronic systolic HF/NICM - on maximally tolerated therapy due to prior hypotension. No significant symptoms - repeat echo, if LVEF remains <35% refer to EP to discuss ICD  2.CAD - mild to moderate CAD by cath - no recent symptoms, continue medical therapy     F/u 3 months.      Arnoldo Lenis, M.D.

## 2018-07-25 ENCOUNTER — Other Ambulatory Visit: Payer: Self-pay | Admitting: Cardiology

## 2018-07-27 ENCOUNTER — Other Ambulatory Visit: Payer: Self-pay

## 2018-07-27 ENCOUNTER — Ambulatory Visit (INDEPENDENT_AMBULATORY_CARE_PROVIDER_SITE_OTHER): Payer: Medicare Other

## 2018-07-27 DIAGNOSIS — I5022 Chronic systolic (congestive) heart failure: Secondary | ICD-10-CM

## 2018-08-01 ENCOUNTER — Telehealth: Payer: Self-pay | Admitting: *Deleted

## 2018-08-01 DIAGNOSIS — R931 Abnormal findings on diagnostic imaging of heart and coronary circulation: Secondary | ICD-10-CM

## 2018-08-01 NOTE — Telephone Encounter (Signed)
Pt agreeable will place referral and forward to schedulers

## 2018-08-01 NOTE — Telephone Encounter (Signed)
-----   Message from Arnoldo Lenis, MD sent at 08/01/2018 10:40 AM EST ----- Yes, please refer to EP to discuss possible defibrillator  J BrancH MD ----- Message ----- From: Massie Maroon, CMA Sent: 07/31/2018   3:09 PM EST To: Arnoldo Lenis, MD  Do we need to refer this pt to EP per recent echo results?  Marnette Perkins

## 2018-08-02 ENCOUNTER — Other Ambulatory Visit: Payer: Medicare Other

## 2018-08-28 ENCOUNTER — Ambulatory Visit: Payer: Medicare Other | Admitting: Internal Medicine

## 2018-08-28 ENCOUNTER — Encounter: Payer: Self-pay | Admitting: Internal Medicine

## 2018-08-28 VITALS — BP 130/74 | HR 55 | Ht 63.0 in | Wt 144.2 lb

## 2018-08-28 DIAGNOSIS — I5022 Chronic systolic (congestive) heart failure: Secondary | ICD-10-CM

## 2018-08-28 DIAGNOSIS — I2510 Atherosclerotic heart disease of native coronary artery without angina pectoris: Secondary | ICD-10-CM | POA: Diagnosis not present

## 2018-08-28 DIAGNOSIS — I447 Left bundle-branch block, unspecified: Secondary | ICD-10-CM

## 2018-08-28 LAB — CBC WITH DIFFERENTIAL/PLATELET
Basophils Absolute: 0 10*3/uL (ref 0.0–0.2)
Basos: 1 %
EOS (ABSOLUTE): 0.3 10*3/uL (ref 0.0–0.4)
Eos: 5 %
Hematocrit: 34.6 % (ref 34.0–46.6)
Hemoglobin: 11.7 g/dL (ref 11.1–15.9)
Immature Grans (Abs): 0 10*3/uL (ref 0.0–0.1)
Immature Granulocytes: 1 %
Lymphocytes Absolute: 1.9 10*3/uL (ref 0.7–3.1)
Lymphs: 32 %
MCH: 30.5 pg (ref 26.6–33.0)
MCHC: 33.8 g/dL (ref 31.5–35.7)
MCV: 90 fL (ref 79–97)
Monocytes Absolute: 0.5 10*3/uL (ref 0.1–0.9)
Monocytes: 8 %
Neutrophils Absolute: 3.2 10*3/uL (ref 1.4–7.0)
Neutrophils: 53 %
Platelets: 311 10*3/uL (ref 150–450)
RBC: 3.83 x10E6/uL (ref 3.77–5.28)
RDW: 12.6 % (ref 11.7–15.4)
WBC: 6 10*3/uL (ref 3.4–10.8)

## 2018-08-28 LAB — BASIC METABOLIC PANEL
BUN/Creatinine Ratio: 31 — ABNORMAL HIGH (ref 12–28)
BUN: 22 mg/dL (ref 8–27)
CO2: 21 mmol/L (ref 20–29)
Calcium: 9.3 mg/dL (ref 8.7–10.3)
Chloride: 106 mmol/L (ref 96–106)
Creatinine, Ser: 0.71 mg/dL (ref 0.57–1.00)
GFR calc Af Amer: 98 mL/min/{1.73_m2} (ref >59)
GFR calc non Af Amer: 85 mL/min/{1.73_m2} (ref >59)
Glucose: 95 mg/dL (ref 65–99)
Potassium: 4.1 mmol/L (ref 3.5–5.2)
Sodium: 142 mmol/L (ref 134–144)

## 2018-08-28 NOTE — H&P (View-Only) (Signed)
Electrophysiology Office Note   Date:  08/28/2018   ID:  Kathleen, Edwards 21-Dec-1945, MRN [id number]  PCP:  Glenda Chroman, MD  Cardiologist:  Dr Harl Bowie Primary Electrophysiologist: Thompson Grayer, MD    CC: CHF   History of Present Illness: Kathleen Edwards is a 73 y.o. female who presents today for electrophysiology evaluation.   She is referred by Dr Harl Bowie for EP consultation regarding CHF and LBBB.  The patient was initially diagnosed with CHF 01/17/18 after presenting with symptoms of worsening SOB, abrupt orthopnea, and edema.  She was noted to have hypertensive emergency at that time.  She was also noted to have an EF of 20% and LBBB.  Cath revealed nonobstructive CAD.  She has been treated with medical therapy, including Toprol and entresto since that time.  Further medicine optimization is limited by low BP.   Echo 07/27/2018 reveals EF 20% after medicine optimization.  The patient reports doing reasonably well currently, but does still have SOB with moderate activity. Today, she denies symptoms of palpitations, chest pain,  orthopnea, PND, lower extremity edema, claudication, dizziness, presyncope, syncope, bleeding, or neurologic sequela. The patient is tolerating medications without difficulties and is otherwise without complaint today.    Past Medical History:  Diagnosis Date  . Acquired absence of both cervix and uterus   . Anxiety disorder   . Asthma   . Degenerative joint disease of hand   . Diverticulosis   . Fatigue   . H/O: osteoarthritis   . History of vitamin A deficiency   . Hyperlipidemia   . Hypertension   . Keratosis, seborrheic   . Lipoma   . Ovarian failure   . Rhinitis, allergic   . Vitamin D deficiency    Past Surgical History:  Procedure Laterality Date  . RIGHT/LEFT HEART CATH AND CORONARY ANGIOGRAPHY N/A 02/15/2018   Procedure: RIGHT/LEFT HEART CATH AND CORONARY ANGIOGRAPHY;  Surgeon: Troy Sine, MD;  Location: [facility];  Service:  Cardiovascular;  Laterality: N/A;  . TOTAL ABDOMINAL HYSTERECTOMY  1979     Current Outpatient Medications  Medication Sig Dispense Refill  . aspirin EC 81 MG tablet Take 81 mg by mouth daily.    . Cholecalciferol (VITAMIN D PO) Take 1 capsule by mouth daily.    Marland Kitchen ENTRESTO 97-103 MG TAKE 1 TABLET BY MOUTH TWICE DAILY 60 tablet 3  . furosemide (LASIX) 20 MG tablet Take 1 tablet (20 mg total) by mouth daily. 90 tablet 3  . losartan (COZAAR) 100 MG tablet Take 100 mg by mouth daily.    . metoprolol succinate (TOPROL-XL) 25 MG 24 hr tablet Take 25 mg by mouth daily.    . potassium chloride (K-DUR) 10 MEQ tablet Take 10 mEq by mouth daily.    . pravastatin (PRAVACHOL) 40 MG tablet Take 40 mg by mouth daily.     No current facility-administered medications for this visit.     Allergies:   Lisinopril and Vioxx [rofecoxib]   Social History:  The patient  reports that she quit smoking about 20 years ago. She has never used smokeless tobacco. She reports previous alcohol use. She reports previous drug use.   Family History:  The patient's  family history includes Brain cancer in her father; CVA in her mother.    ROS:  Please see the history of present illness.   All other systems are personally reviewed and negative.    PHYSICAL EXAM: VS:  BP 130/74  Pulse (!) 55   Ht 5\' 3"  (1.6 m)   Wt 144 lb 3.2 oz (65.4 kg)   SpO2 97%   BMI 25.54 kg/m  , BMI Body mass index is 25.54 kg/m. GEN: Well nourished, well developed, in no acute distress  HEENT: normal  Neck: no JVD, carotid bruits, or masses Cardiac: RRR; no murmurs, rubs, or gallops,no edema  Respiratory:  clear to auscultation bilaterally, normal work of breathing GI: soft, nontender, nondistended, + BS MS: no deformity or atrophy  Skin: warm and dry  Neuro:  Strength and sensation are intact Psych: euthymic mood, full affect  EKG:  EKG is ordered today. The ekg ordered today is personally reviewed and shows sinus rhythm 55 bpm,  PR 226 msec, LBBB with QRS 172 msec     Wt Readings from Last 3 Encounters:  08/28/18 144 lb 3.2 oz (65.4 kg)  07/21/18 145 lb (65.8 kg)  04/21/18 143 lb 9.6 oz (65.1 kg)      Other studies personally reviewed: Additional studies/ records that were reviewed today include: Dr Nelly Laurence notes, prior echos, ekgs  Review of the above records today demonstrates: as above   ASSESSMENT AND PLAN:  1.  The patient has a nonischemic CM (EF 20%), NYHA Class II-III CHF, and LBBB.  She is referred by Dr Harl Bowie for risk stratification of sudden death and consideration of ICD implantation.  At this time, she meets MADIT II/ SCD-HeFT criteria for ICD implantation for primary prevention of sudden death.  Given LBBB with QRS > 150 msec, she also has class I indication for CRT.   I have had a thorough discussion with the patient reviewing options.  The patient and their daughter have had opportunities to ask questions and have them answered. The patient and I have decided together through a shared decision making process to BiV ICD at this time.   Risks, benefits, alternatives to BiV ICD implantation were discussed in detail with the patient today. The patient understands that the risks include but are not limited to bleeding, infection, pneumothorax, perforation, tamponade, vascular damage, renal failure, MI, stroke, death, inappropriate shocks, and lead dislodgement and wishes to proceed.  We will therefore schedule device implantation at the next available time.    Current medicines are reviewed at length with the patient today.   The patient does not have concerns regarding her medicines.  The following changes were made today:  none  Labs/ tests ordered today include:  Orders Placed This Encounter  Procedures  . Basic Metabolic Panel (BMET)  . CBC w/Diff  . EKG 12-Lead     Signed, Thompson Grayer, MD  08/28/2018 11:21 AM     [facility] [address] [place]  [phone] (office) [phone] (fax)

## 2018-08-28 NOTE — Patient Instructions (Addendum)
Medication Instructions:  Your physician recommends that you continue on your current medications as directed. Please refer to the Current Medication list given to you today.  Labwork: You will get lab work today:  BMP and CBC.  Testing/Procedures: Your physician has recommended that you have a defibrillator inserted. An implantable cardioverter defibrillator (ICD) is a small device that is placed in your chest or, in rare cases, your abdomen. This device uses electrical pulses or shocks to help control life-threatening, irregular heartbeats that could lead the heart to suddenly stop beating (sudden cardiac arrest). Leads are attached to the ICD that goes into your heart. This is done in the hospital and usually requires an overnight stay. Please see the instruction sheet given to you today for more information.  Follow-Up: You will follow up with device clinic 10-14 days after your procedure for a wound check.  You will follow up with Dr. Rayann Heman 91 days after your procedure.    DEFIBRILLATOR INSTRUCTIONS:  Please arrive to ADMITTING down the hall from the [facility] main entrance of [place] hospital at:  11:30 am on September 12, 2018 Use the CHG surgical scrub as directed Do not eat or drink after midnight prior to procedure Do not take any medications the morning of the procedure Plan for one night stay You will need someone to drive you home at discharge    Cardioverter Defibrillator Implantation  An implantable cardioverter defibrillator (ICD) is a small device that is placed under the skin in the chest or abdomen. An ICD consists of a battery, a small computer (pulse generator), and wires (leads) that go into the heart. An ICD is used to detect and correct two types of dangerous irregular heartbeats (arrhythmias):  A rapid heart rhythm (tachycardia).  An arrhythmia in which the lower chambers of the heart (ventricles) contract in an uncoordinated way (fibrillation). When  an ICD detects tachycardia, it sends a low-energy shock to the heart to restore the heartbeat to normal (cardioversion). This signal is usually painless. If cardioversion does not work or if the ICD detects fibrillation, it delivers a high-energy shock to the heart (defibrillation) to restart the heart. This shock may feel like a strong jolt in the chest. Your health care provider may prescribe an ICD if:  You have had an arrhythmia that originated in the ventricles.  Your heart has been damaged by a disease or heart condition. Sometimes, ICDs are programmed to act as a device called a pacemaker. Pacemakers can be used to treat a slow heartbeat (bradycardia) or tachycardia by taking over the heart rate with electrical impulses. Tell a health care provider about:  Any allergies you have.  All medicines you are taking, including vitamins, herbs, eye drops, creams, and over-the-counter medicines.  Any problems you or family members have had with anesthetic medicines.  Any blood disorders you have.  Any surgeries you have had.  Any medical conditions you have.  Whether you are pregnant or may be pregnant. What are the risks? Generally, this is a safe procedure. However, problems may occur, including:  Swelling, bleeding, or bruising.  Infection.  Blood clots.  Damage to other structures or organs, such as nerves, blood vessels, or the heart.  Allergic reactions to medicines used during the procedure. What happens before the procedure? Staying hydrated Follow instructions from your health care provider about hydration, which may include:  Up to 2 hours before the procedure - you may continue to drink clear liquids, such as water, clear fruit  juice, black coffee, and plain tea. Eating and drinking restrictions Follow instructions from your health care provider about eating and drinking, which may include:  8 hours before the procedure - stop eating heavy meals or foods such as  meat, fried foods, or fatty foods.  6 hours before the procedure - stop eating light meals or foods, such as toast or cereal.  6 hours before the procedure - stop drinking milk or drinks that contain milk.  2 hours before the procedure - stop drinking clear liquids. Medicine Ask your health care provider about:  Changing or stopping your normal medicines. This is important if you take diabetes medicines or blood thinners.  Taking medicines such as aspirin and ibuprofen. These medicines can thin your blood. Do not take these medicines before your procedure if your doctor tells you not to. Tests  You may have blood tests.  You may have a test to check the electrical signals in your heart (electrocardiogram, ECG).  You may have imaging tests, such as a chest X-ray. General instructions  For 24 hours before the procedure, stop using products that contain nicotine or tobacco, such as cigarettes and e-cigarettes. If you need help quitting, ask your health care provider.  Plan to have someone take you home from the hospital or clinic.  You may be asked to shower with a germ-killing soap. What happens during the procedure?  To reduce your risk of infection: ? Your health care team will wash or sanitize their hands. ? Your skin will be washed with soap. ? Hair may be removed from the surgical area.  Small monitors will be put on your body. They will be used to check your heart, blood pressure, and oxygen level.  An IV tube will be inserted into one of your veins.  You will be given one or more of the following: ? A medicine to help you relax (sedative). ? A medicine to numb the area (local anesthetic). ? A medicine to make you fall asleep (general anesthetic).  Leads will be guided through a blood vessel into your heart and attached to your heart muscles. Depending on the ICD, the leads may go into one ventricle or they may go into both ventricles and into an upper chamber of the  heart. An X-ray machine (fluoroscope) will be usedto help guide the leads.  A small incision will be made to create a deep pocket under your skin.  The pulse generator will be placed into the pocket.  The ICD will be tested.  The incision will be closed with stitches (sutures), skin glue, or staples.  A bandage (dressing) will be placed over the incision. This procedure may vary among health care providers and hospitals. What happens after the procedure?  Your blood pressure, heart rate, breathing rate, and blood oxygen level will be monitored often until the medicines you were given have worn off.  A chest X-ray will be taken to check that the ICD is in the right place.  You will need to stay in the hospital for 1-2 days so your health care provider can make sure your ICD is working.  Do not drive for 24 hours if you received a sedative. Ask your health care provider when it is safe for you to drive.  You may be given an identification card explaining that you have an ICD. Summary  An implantable cardioverter defibrillator (ICD) is a small device that is placed under the skin in the chest or abdomen. It is  used to detect and correct dangerous irregular heartbeats (arrhythmias).  An ICD consists of a battery, a small computer (pulse generator), and wires (leads) that go into the heart.  When an ICD detects rapid heart rhythm (tachycardia), it sends a low-energy shock to the heart to restore the heartbeat to normal (cardioversion). If cardioversion does not work or if the ICD detects uncoordinated heart contractions (fibrillation), it delivers a high-energy shock to the heart (defibrillation) to restart the heart.  You will need to stay in the hospital for 1-2 days to make sure your ICD is working. This information is not intended to replace advice given to you by your health care provider. Make sure you discuss any questions you have with your health care provider. Document Released:  04/03/2002 Document Revised: 07/21/2016 Document Reviewed: 07/21/2016 Elsevier Interactive Patient Education  [employer].  If you need a refill on your cardiac medications before your next appointment, please call your pharmacy.

## 2018-08-28 NOTE — Progress Notes (Signed)
Electrophysiology Office Note   Date:  08/28/2018   ID:  [name], [name] [birth year], MRN [id number]  PCP:  Glenda Chroman, MD  Cardiologist:  Dr Harl Bowie Primary Electrophysiologist: Thompson Grayer, MD    CC: CHF   History of Present Illness: Kathleen Edwards is a 73 y.o. female who presents today for electrophysiology evaluation.   She is referred by Dr Harl Bowie for EP consultation regarding CHF and LBBB.  The patient was initially diagnosed with CHF 01/17/18 after presenting with symptoms of worsening SOB, abrupt orthopnea, and edema.  She was noted to have hypertensive emergency at that time.  She was also noted to have an EF of 20% and LBBB.  Cath revealed nonobstructive CAD.  She has been treated with medical therapy, including Toprol and entresto since that time.  Further medicine optimization is limited by low BP.   Echo 07/27/2018 reveals EF 20% after medicine optimization.  The patient reports doing reasonably well currently, but does still have SOB with moderate activity. Today, she denies symptoms of palpitations, chest pain,  orthopnea, PND, lower extremity edema, claudication, dizziness, presyncope, syncope, bleeding, or neurologic sequela. The patient is tolerating medications without difficulties and is otherwise without complaint today.    Past Medical History:  Diagnosis Date  . Acquired absence of both cervix and uterus   . Anxiety disorder   . Asthma   . Degenerative joint disease of hand   . Diverticulosis   . Fatigue   . H/O: osteoarthritis   . History of vitamin A deficiency   . Hyperlipidemia   . Hypertension   . Keratosis, seborrheic   . Lipoma   . Ovarian failure   . Rhinitis, allergic   . Vitamin D deficiency    Past Surgical History:  Procedure Laterality Date  . RIGHT/LEFT HEART CATH AND CORONARY ANGIOGRAPHY N/A 02/15/2018   Procedure: RIGHT/LEFT HEART CATH AND CORONARY ANGIOGRAPHY;  Surgeon: Troy Sine, MD;  Location: [facility];  Service:  Cardiovascular;  Laterality: N/A;  . TOTAL ABDOMINAL HYSTERECTOMY  1979     Current Outpatient Medications  Medication Sig Dispense Refill  . aspirin EC 81 MG tablet Take 81 mg by mouth daily.    . Cholecalciferol (VITAMIN D PO) Take 1 capsule by mouth daily.    Marland Kitchen ENTRESTO 97-103 MG TAKE 1 TABLET BY MOUTH TWICE DAILY 60 tablet 3  . furosemide (LASIX) 20 MG tablet Take 1 tablet (20 mg total) by mouth daily. 90 tablet 3  . losartan (COZAAR) 100 MG tablet Take 100 mg by mouth daily.    . metoprolol succinate (TOPROL-XL) 25 MG 24 hr tablet Take 25 mg by mouth daily.    . potassium chloride (K-DUR) 10 MEQ tablet Take 10 mEq by mouth daily.    . pravastatin (PRAVACHOL) 40 MG tablet Take 40 mg by mouth daily.     No current facility-administered medications for this visit.     Allergies:   Lisinopril and Vioxx [rofecoxib]   Social History:  The patient  reports that she quit smoking about 20 years ago. She has never used smokeless tobacco. She reports previous alcohol use. She reports previous drug use.   Family History:  The patient's  family history includes Brain cancer in her father; CVA in her mother.    ROS:  Please see the history of present illness.   All other systems are personally reviewed and negative.    PHYSICAL EXAM: VS:  BP 130/74  Pulse (!) 55   Ht 5\' 3"  (1.6 m)   Wt 144 lb 3.2 oz (65.4 kg)   SpO2 97%   BMI 25.54 kg/m  , BMI Body mass index is 25.54 kg/m. GEN: Well nourished, well developed, in no acute distress  HEENT: normal  Neck: no JVD, carotid bruits, or masses Cardiac: RRR; no murmurs, rubs, or gallops,no edema  Respiratory:  clear to auscultation bilaterally, normal work of breathing GI: soft, nontender, nondistended, + BS MS: no deformity or atrophy  Skin: warm and dry  Neuro:  Strength and sensation are intact Psych: euthymic mood, full affect  EKG:  EKG is ordered today. The ekg ordered today is personally reviewed and shows sinus rhythm 55 bpm,  PR 226 msec, LBBB with QRS 172 msec     Wt Readings from Last 3 Encounters:  08/28/18 144 lb 3.2 oz (65.4 kg)  07/21/18 145 lb (65.8 kg)  04/21/18 143 lb 9.6 oz (65.1 kg)      Other studies personally reviewed: Additional studies/ records that were reviewed today include: Dr Nelly Laurence notes, prior echos, ekgs  Review of the above records today demonstrates: as above   ASSESSMENT AND PLAN:  1.  The patient has a nonischemic CM (EF 20%), NYHA Class II-III CHF, and LBBB.  She is referred by Dr Harl Bowie for risk stratification of sudden death and consideration of ICD implantation.  At this time, she meets MADIT II/ SCD-HeFT criteria for ICD implantation for primary prevention of sudden death.  Given LBBB with QRS > 150 msec, she also has class I indication for CRT.   I have had a thorough discussion with the patient reviewing options.  The patient and their daughter have had opportunities to ask questions and have them answered. The patient and I have decided together through a shared decision making process to BiV ICD at this time.   Risks, benefits, alternatives to BiV ICD implantation were discussed in detail with the patient today. The patient understands that the risks include but are not limited to bleeding, infection, pneumothorax, perforation, tamponade, vascular damage, renal failure, MI, stroke, death, inappropriate shocks, and lead dislodgement and wishes to proceed.  We will therefore schedule device implantation at the next available time.    Current medicines are reviewed at length with the patient today.   The patient does not have concerns regarding her medicines.  The following changes were made today:  none  Labs/ tests ordered today include:  Orders Placed This Encounter  Procedures  . Basic Metabolic Panel (BMET)  . CBC w/Diff  . EKG 12-Lead     Signed, Thompson Grayer, MD  08/28/2018 11:21 AM     [facility] [address] [place]  [address] [phone] (office) [phone] (fax)

## 2018-09-12 ENCOUNTER — Other Ambulatory Visit: Payer: Self-pay

## 2018-09-12 ENCOUNTER — Ambulatory Visit (HOSPITAL_COMMUNITY)
Admission: RE | Admit: 2018-09-12 | Discharge: 2018-09-13 | Disposition: A | Discharge status: Home / Self Care | Payer: Medicare Other | Attending: Internal Medicine | Admitting: Internal Medicine

## 2018-09-12 ENCOUNTER — Encounter (HOSPITAL_COMMUNITY)
Admission: RE | Disposition: A | Discharge status: Home / Self Care | Payer: Self-pay | Source: Home / Self Care | Attending: Internal Medicine

## 2018-09-12 DIAGNOSIS — I447 Left bundle-branch block, unspecified: Secondary | ICD-10-CM | POA: Insufficient documentation

## 2018-09-12 DIAGNOSIS — Z006 Encounter for examination for normal comparison and control in clinical research program: Secondary | ICD-10-CM | POA: Diagnosis not present

## 2018-09-12 DIAGNOSIS — I110 Hypertensive heart disease with heart failure: Secondary | ICD-10-CM | POA: Insufficient documentation

## 2018-09-12 DIAGNOSIS — I509 Heart failure, unspecified: Secondary | ICD-10-CM | POA: Insufficient documentation

## 2018-09-12 DIAGNOSIS — Z87891 Personal history of nicotine dependence: Secondary | ICD-10-CM | POA: Insufficient documentation

## 2018-09-12 DIAGNOSIS — E785 Hyperlipidemia, unspecified: Secondary | ICD-10-CM | POA: Insufficient documentation

## 2018-09-12 DIAGNOSIS — Z79899 Other long term (current) drug therapy: Secondary | ICD-10-CM | POA: Diagnosis not present

## 2018-09-12 DIAGNOSIS — Z7982 Long term (current) use of aspirin: Secondary | ICD-10-CM | POA: Diagnosis not present

## 2018-09-12 DIAGNOSIS — Z823 Family history of stroke: Secondary | ICD-10-CM | POA: Diagnosis not present

## 2018-09-12 DIAGNOSIS — Z888 Allergy status to other drugs, medicaments and biological substances status: Secondary | ICD-10-CM | POA: Diagnosis not present

## 2018-09-12 DIAGNOSIS — Z90710 Acquired absence of both cervix and uterus: Secondary | ICD-10-CM | POA: Insufficient documentation

## 2018-09-12 DIAGNOSIS — I5022 Chronic systolic (congestive) heart failure: Secondary | ICD-10-CM

## 2018-09-12 DIAGNOSIS — I428 Other cardiomyopathies: Secondary | ICD-10-CM | POA: Diagnosis not present

## 2018-09-12 DIAGNOSIS — Z959 Presence of cardiac and vascular implant and graft, unspecified: Secondary | ICD-10-CM

## 2018-09-12 DIAGNOSIS — I2510 Atherosclerotic heart disease of native coronary artery without angina pectoris: Secondary | ICD-10-CM | POA: Diagnosis not present

## 2018-09-12 HISTORY — PX: BIV ICD INSERTION CRT-D: EP1195

## 2018-09-12 LAB — SURGICAL PCR SCREEN
MRSA, PCR: NEGATIVE
Staphylococcus aureus: POSITIVE — AB

## 2018-09-12 SURGERY — BIV ICD INSERTION CRT-D

## 2018-09-12 MED ORDER — LOSARTAN POTASSIUM 50 MG PO TABS: 100.0000 mg | ORAL_TABLET | Freq: Every day | ORAL | Status: DC

## 2018-09-12 MED ORDER — SODIUM CHLORIDE 0.9 % IV SOLN
80.0000 mg | INTRAVENOUS | Status: AC
  Administered 2018-09-12: 80 mg

## 2018-09-12 MED ORDER — SODIUM CHLORIDE 0.9 % IV SOLN
INTRAVENOUS | Status: DC
  Administered 2018-09-12: 12:00:00 via INTRAVENOUS

## 2018-09-12 MED ORDER — CHLORHEXIDINE GLUCONATE 4 % EX LIQD: 60.0000 mL | Freq: Once | CUTANEOUS | Status: DC

## 2018-09-12 MED ORDER — MUPIROCIN 2 % EX OINT
TOPICAL_OINTMENT | CUTANEOUS | Status: AC
  Filled 2018-09-12: qty 22

## 2018-09-12 MED ORDER — FENTANYL CITRATE (PF) 100 MCG/2ML IJ SOLN
INTRAMUSCULAR | Status: DC | PRN
  Administered 2018-09-12 (×3): 25 ug via INTRAVENOUS
  Administered 2018-09-12: 12.5 ug via INTRAVENOUS

## 2018-09-12 MED ORDER — ACETAMINOPHEN 325 MG PO TABS
325.0000 mg | ORAL_TABLET | ORAL | Status: DC | PRN
  Administered 2018-09-12: 650 mg via ORAL
  Filled 2018-09-12: qty 2

## 2018-09-12 MED ORDER — SODIUM CHLORIDE 0.9% FLUSH: 3.0000 mL | INTRAVENOUS | Status: DC | PRN

## 2018-09-12 MED ORDER — CEFAZOLIN SODIUM-DEXTROSE 2-4 GM/100ML-% IV SOLN
INTRAVENOUS | Status: AC
  Filled 2018-09-12: qty 100

## 2018-09-12 MED ORDER — LIDOCAINE HCL (PF) 1 % IJ SOLN
INTRAMUSCULAR | Status: AC
  Filled 2018-09-12: qty 60

## 2018-09-12 MED ORDER — MIDAZOLAM HCL 5 MG/5ML IJ SOLN
INTRAMUSCULAR | Status: AC
  Filled 2018-09-12: qty 5

## 2018-09-12 MED ORDER — MIDAZOLAM HCL 5 MG/5ML IJ SOLN
INTRAMUSCULAR | Status: DC | PRN
  Administered 2018-09-12 (×7): 1 mg via INTRAVENOUS

## 2018-09-12 MED ORDER — CEFAZOLIN SODIUM-DEXTROSE 1-4 GM/50ML-% IV SOLN
1.0000 g | Freq: Four times a day (QID) | INTRAVENOUS | Status: AC
  Administered 2018-09-12 – 2018-09-13 (×3): 1 g via INTRAVENOUS
  Filled 2018-09-12 (×3): qty 50

## 2018-09-12 MED ORDER — LIDOCAINE HCL (PF) 1 % IJ SOLN
INTRAMUSCULAR | Status: DC | PRN
  Administered 2018-09-12: 45 mL

## 2018-09-12 MED ORDER — CEFAZOLIN SODIUM-DEXTROSE 2-4 GM/100ML-% IV SOLN
2.0000 g | INTRAVENOUS | Status: AC
  Administered 2018-09-12: 2 g via INTRAVENOUS

## 2018-09-12 MED ORDER — IOPAMIDOL (ISOVUE-370) INJECTION 76%
INTRAVENOUS | Status: AC
  Filled 2018-09-12: qty 50

## 2018-09-12 MED ORDER — FENTANYL CITRATE (PF) 100 MCG/2ML IJ SOLN
INTRAMUSCULAR | Status: AC
  Filled 2018-09-12: qty 2

## 2018-09-12 MED ORDER — IOPAMIDOL (ISOVUE-370) INJECTION 76%
INTRAVENOUS | Status: DC | PRN
  Administered 2018-09-12: 15 mL via INTRAVENOUS
  Administered 2018-09-12: 14 mL via INTRAVENOUS

## 2018-09-12 MED ORDER — POTASSIUM CHLORIDE CRYS ER 10 MEQ PO TBCR
10.0000 meq | EXTENDED_RELEASE_TABLET | Freq: Every day | ORAL | Status: DC
  Administered 2018-09-13: 10 meq via ORAL
  Filled 2018-09-12 (×2): qty 1

## 2018-09-12 MED ORDER — METOPROLOL SUCCINATE ER 25 MG PO TB24
25.0000 mg | ORAL_TABLET | Freq: Every day | ORAL | Status: DC
  Administered 2018-09-13: 25 mg via ORAL
  Filled 2018-09-12: qty 1

## 2018-09-12 MED ORDER — ONDANSETRON HCL 4 MG/2ML IJ SOLN: 4.0000 mg | Freq: Four times a day (QID) | INTRAMUSCULAR | Status: DC | PRN

## 2018-09-12 MED ORDER — HEPARIN (PORCINE) IN NACL 1000-0.9 UT/500ML-% IV SOLN
INTRAVENOUS | Status: DC | PRN
  Administered 2018-09-12: 500 mL

## 2018-09-12 MED ORDER — SACUBITRIL-VALSARTAN 97-103 MG PO TABS
1.0000 | ORAL_TABLET | Freq: Two times a day (BID) | ORAL | Status: DC
  Administered 2018-09-13: 1 via ORAL
  Filled 2018-09-12: qty 1

## 2018-09-12 MED ORDER — HEPARIN (PORCINE) IN NACL 1000-0.9 UT/500ML-% IV SOLN
INTRAVENOUS | Status: AC
  Filled 2018-09-12: qty 500

## 2018-09-12 MED ORDER — HYDROCODONE-ACETAMINOPHEN 5-325 MG PO TABS
1.0000 | ORAL_TABLET | ORAL | Status: DC | PRN
  Administered 2018-09-12: 1 via ORAL
  Filled 2018-09-12: qty 1

## 2018-09-12 MED ORDER — SODIUM CHLORIDE 0.9 % IV SOLN
250.0000 mL | INTRAVENOUS | Status: DC | PRN
  Administered 2018-09-12 – 2018-09-13 (×2): 250 mL via INTRAVENOUS

## 2018-09-12 MED ORDER — FUROSEMIDE 20 MG PO TABS
20.0000 mg | ORAL_TABLET | Freq: Every day | ORAL | Status: DC
  Administered 2018-09-13: 20 mg via ORAL
  Filled 2018-09-12: qty 1

## 2018-09-12 MED ORDER — MUPIROCIN 2 % EX OINT
1.0000 "application " | TOPICAL_OINTMENT | Freq: Once | CUTANEOUS | Status: AC
  Administered 2018-09-12: 1 via TOPICAL

## 2018-09-12 MED ORDER — SODIUM CHLORIDE 0.9% FLUSH
3.0000 mL | Freq: Two times a day (BID) | INTRAVENOUS | Status: DC
  Administered 2018-09-12: 3 mL via INTRAVENOUS

## 2018-09-12 MED ORDER — SODIUM CHLORIDE 0.9 % IV SOLN
INTRAVENOUS | Status: AC
  Filled 2018-09-12: qty 2

## 2018-09-12 SURGICAL SUPPLY — 21 items
ADAPTER SEALING SSA-EW-09 (MISCELLANEOUS) ×3 IMPLANT
ASSURA CRTD CD3369-40Q (ICD Generator) ×3 IMPLANT
CABLE SURGICAL S-101-97-12 (CABLE) ×3 IMPLANT
CATH ATTAIN COM SURV 6250V-AM (CATHETERS) ×3 IMPLANT
CATH ATTAIN COM SURV 6250V-MB2 (CATHETERS) ×3 IMPLANT
CATH ATTAIN COM SURV 6250V-MPR (CATHETERS) ×3 IMPLANT
CATH ATTAIN SEL SURV 6248V-90 (CATHETERS) ×3 IMPLANT
CATH HEXAPOLAR DAMATO 6F (CATHETERS) ×3 IMPLANT
DEFIB ASSURA MULTI-CHMBR CRT-D (ICD Generator) ×1 IMPLANT
KIT ESSENTIALS PG (KITS) ×3 IMPLANT
LEAD DURATA 7122Q-65CM (Lead) ×3 IMPLANT
LEAD QUARTET 1458Q-86CM (Lead) ×3 IMPLANT
LEAD TENDRIL MRI 52CM LPA1200M (Lead) ×3 IMPLANT
PAD PRO RADIOLUCENT 2001M-C (PAD) ×3 IMPLANT
SHEATH CLASSIC 7F (SHEATH) ×3 IMPLANT
SHEATH CLASSIC 8F (SHEATH) ×3 IMPLANT
SHEATH CLASSIC 9.5F (SHEATH) ×3 IMPLANT
SLITTER 6232ADJ (MISCELLANEOUS) ×3 IMPLANT
TRAY PACEMAKER INSERTION (PACKS) ×3 IMPLANT
WIRE ACUITY WHISPER EDS 4648 (WIRE) ×3 IMPLANT
WIRE HI TORQ VERSACORE-J 145CM (WIRE) ×3 IMPLANT

## 2018-09-12 NOTE — Discharge Summary (Addendum)
ELECTROPHYSIOLOGY PROCEDURE DISCHARGE SUMMARY    Patient ID: Kathleen Edwards,  MRN: [id number], DOB/AGE: Jan 11, 1946 73 y.o.  Admit date: 09/12/2018 Discharge date: 09/13/2018  Primary Care Physician: Glenda Chroman, MD Primary Cardiologist: Dr. Harl Bowie Electrophysiologist: Dr. Rayann Heman  Primary Discharge Diagnosis:  1. NICM 2. LBBB  Secondary Discharge Diagnosis:  1. HTN  Allergies  Allergen Reactions  . Lisinopril Cough  . Vioxx [Rofecoxib] Other (See Comments)    Epigastric discomfort      Procedures This Admission:  1.  Implantation of a SJM CRT-D on 09/12/2018 by Dr Rayann Heman.  The patient received a Comptroller Tendril MRI model LPA1200M-  52 (serial number  [id number]) right atrial lead and a [place], model 7122-65 (serial number [id number]) right ventricular defibrillator lead, [place] model [phone] (serial number [id number]) lead (LV), [employer] Quadra Assura MP model [phone] (serial  Number [id number]) biventricular ICD DFT's were deferred at time of implant   There were no immediate post procedure complications. 2.  CXR on 09/13/2018 demonstrated no pneumothorax status post device implantation.   Brief HPI: JELESA Edwards is a 73 y.o. female was referred to electrophysiology in the outpatient setting for consideration of ICD implantation.  Past medical history includes above.  The patient has persistent LV dysfunction despite guideline directed therapy.  Risks, benefits, and alternatives to ICD implantation were reviewed with the patient who wished to proceed.   Hospital Course:  The patient was admitted and underwent implantation of an ICD with details as outlined above. He was monitored on telemetry overnight which demonstrated SR/ VP.  Left chest was without hematoma or ecchymosis.  The device was interrogated and found to be functioning normally.  CXR was obtained and demonstrated no pneumothorax status post device implantation.   Wound care, arm mobility, and restrictions were reviewed with the patient.  The patient feels well, denies any CP or SOB, minimal site discomfort.  He was examined by Dr. Rayann Heman and considered stable for discharge to home.   The patient's discharge medications include an ARB (Entresto) and beta blocker (metoprolol).   Physical Exam: Vitals:   09/12/18 1900 09/13/18 0031 09/13/18 0514 09/13/18 0846  BP: 123/62 118/85 (!) 142/65 (!) 154/73  Pulse: 80 68 64 63  Resp:  18 18   Temp: 98 F (36.7 C) 98.1 F (36.7 C) 98 F (36.7 C)   TempSrc:  Oral Oral   SpO2:  97% 98%   Weight:   65.3 kg   Height:        GEN- The patient is well appearing, alert and oriented x 3 today.   HEENT: normocephalic, atraumatic; sclera clear, conjunctiva pink; hearing intact; oropharynx clear Lungs- CTA b/l, normal work of breathing.  No wheezes, rales, rhonchi Heart- RRR, no murmurs, rubs or gallops, PMI not laterally displaced GI- soft, non-tender, non-distended Extremities- no clubbing, cyanosis, or edema MS- no significant deformity or atrophy Skin- warm and dry, no rash or lesion, left chest without hematoma/ecchymosis Psych- euthymic mood, full affect Neuro- no gross defecits  Labs:   Lab Results  Component Value Date   WBC 6.0 08/28/2018   HGB 11.7 08/28/2018   HCT 34.6 08/28/2018   MCV 90 08/28/2018   PLT 311 08/28/2018    Recent Labs  Lab 09/13/18 0309  NA 140  K 3.4*  CL 109  CO2 23  BUN 21  CREATININE 0.57  CALCIUM 8.0*  GLUCOSE 90    Discharge  Medications:  Allergies as of 09/13/2018      Reactions   Lisinopril Cough   Vioxx [rofecoxib] Other (See Comments)   Epigastric discomfort       Medication List    STOP taking these medications   losartan 100 MG tablet Commonly known as:  COZAAR     TAKE these medications   aspirin EC 81 MG tablet Take 81 mg by mouth daily.   ENTRESTO 97-103 MG Generic drug:  sacubitril-valsartan TAKE 1 TABLET BY MOUTH TWICE DAILY     furosemide 20 MG tablet Commonly known as:  LASIX Take 1 tablet (20 mg total) by mouth daily.   metoprolol succinate 25 MG 24 hr tablet Commonly known as:  TOPROL-XL Take 25 mg by mouth daily.   potassium chloride 10 MEQ tablet Commonly known as:  K-DUR Take 10 mEq by mouth daily.   pravastatin 40 MG tablet Commonly known as:  PRAVACHOL Take 40 mg by mouth daily.   VITAMIN D PO Take 1 capsule by mouth daily.       Disposition:   Home  Discharge Instructions    Diet - low sodium heart healthy   Complete by:  As directed    Increase activity slowly   Complete by:  As directed      Follow-up Information    [facility].   Specialty:  Cardiology Why:  09/25/2018 @ 10:00AM, wound check visit Contact information: [address] [place]       Thompson Grayer, MD Follow up.   Specialty:  Cardiology Why:  01/05/2019 @ 11:30AM Contact information: [place] [address] [phone]           Duration of Discharge Encounter: Greater than 30 minutes including physician time.  Venetia Night, PA-C 09/13/2018 10:13 AM  Device interrogation reviewed personally and normal.  CXR reveals no ptx, stable leads.  DC to home with routine wound care and follow-up.

## 2018-09-12 NOTE — Interval H&P Note (Signed)
History and Physical Interval Note:  09/12/2018 1:11 PM  KALIOPE QUINONEZ  has presented today for surgery, with the diagnosis of hf - left bundle branch block  The various methods of treatment have been discussed with the patient and family. After consideration of risks, benefits and other options for treatment, the patient has consented to  Procedure(s): BIV ICD INSERTION CRT-D (N/A) as a surgical intervention .  The patient's history has been reviewed, patient examined, no change in status, stable for surgery.  I have reviewed the patient's chart and labs.  Questions were answered to the patient's satisfaction.    ICD Criteria  Current LVEF:25%. Within 12 months prior to implant: Yes   Heart failure history: Yes, Class III  Cardiomyopathy history:  Nonischemic CM  Atrial Fibrillation/Atrial Flutter: No.  Ventricular tachycardia history: No.  Cardiac arrest history: No.  History of syndromes with risk of sudden death: No.  Previous ICD: No  Current ICD indication: Primary  PPM indication: LBBB, CRT  Beta Blocker therapy for 3 or more months: Yes, prescribed.   Ace Inhibitor/ARB therapy for 3 or more months: Yes, prescribed.    I have seen LENDY DITTRICH is a 73 y.o. femalepre-procedural and has been referred by Dr Harl Bowie for consideration of BiV ICD implant for primary prevention of sudden death.  The patient's chart has been reviewed and they meet criteria for BiVICD implant.  I have had a thorough discussion with the patient reviewing options.  The patient have had opportunities to ask questions and have them answered. The patient and I have decided together through the [employer] to proceed with BiV ICD at this time.  Risks, benefits, alternatives to ICD implantation were discussed in detail with the patient today. The patient  understands that the risks include but are not limited to bleeding, infection, pneumothorax, perforation, tamponade,  vascular damage, renal failure, MI, stroke, death, inappropriate shocks, and lead dislodgement and  wishes to proceed.    Thompson Grayer MD, [facility] 09/12/2018 1:13 PM

## 2018-09-13 ENCOUNTER — Ambulatory Visit (HOSPITAL_COMMUNITY): Payer: Medicare Other

## 2018-09-13 ENCOUNTER — Encounter (HOSPITAL_COMMUNITY): Payer: Self-pay | Admitting: Internal Medicine

## 2018-09-13 DIAGNOSIS — Z006 Encounter for examination for normal comparison and control in clinical research program: Secondary | ICD-10-CM | POA: Diagnosis not present

## 2018-09-13 DIAGNOSIS — I447 Left bundle-branch block, unspecified: Secondary | ICD-10-CM | POA: Diagnosis not present

## 2018-09-13 DIAGNOSIS — I110 Hypertensive heart disease with heart failure: Secondary | ICD-10-CM | POA: Diagnosis not present

## 2018-09-13 DIAGNOSIS — I428 Other cardiomyopathies: Secondary | ICD-10-CM

## 2018-09-13 LAB — BASIC METABOLIC PANEL
Anion gap: 8 (ref 5–15)
BUN: 21 mg/dL (ref 8–23)
CO2: 23 mmol/L (ref 22–32)
Calcium: 8 mg/dL — ABNORMAL LOW (ref 8.9–10.3)
Chloride: 109 mmol/L (ref 98–111)
Creatinine, Ser: 0.57 mg/dL (ref 0.44–1.00)
GFR calc Af Amer: >60 mL/min (ref >60)
GFR calc non Af Amer: >60 mL/min (ref >60)
Glucose, Bld: 90 mg/dL (ref 70–99)
Potassium: 3.4 mmol/L — ABNORMAL LOW (ref 3.5–5.1)
Sodium: 140 mmol/L (ref 135–145)

## 2018-09-13 MED ORDER — POTASSIUM CHLORIDE CRYS ER 20 MEQ PO TBCR
30.0000 meq | EXTENDED_RELEASE_TABLET | Freq: Once | ORAL | Status: AC
  Administered 2018-09-13: 30 meq via ORAL
  Filled 2018-09-13: qty 1

## 2018-09-13 NOTE — Discharge Instructions (Signed)
° ° °  Supplemental Discharge Instructions for  Pacemaker/Defibrillator Patients  Activity No heavy lifting or vigorous activity with your left/right arm for 6 weeks.  You may return to work in 6 weeks.  Do not raise your left/right arm above your head for one week.  Gradually raise your affected arm as drawn below.              09/16/2018               09/17/2018                 09/18/2018              09/19/2018 __  NO DRIVING for  1 week   ; you may begin driving on  3/00/7622   .  WOUND CARE - Keep the wound area clean and dry.  Do not get this area wet, no showers until cleared to at your wound check visit . - The tape/steri-strips on your wound will fall off; do not pull them off.  No bandage is needed on the site.  DO  NOT apply any creams, oils, or ointments to the wound area. - If you notice any drainage or discharge from the wound, any swelling or bruising at the site, or you develop a fever > 101? F after you are discharged home, call the office at once.  Special Instructions - You are still able to use cellular telephones; use the ear opposite the side where you have your pacemaker/defibrillator.  Avoid carrying your cellular phone near your device. - When traveling through airports, show security personnel your identification card to avoid being screened in the metal detectors.  Ask the security personnel to use the hand wand. - Avoid arc welding equipment, MRI testing (magnetic resonance imaging), TENS units (transcutaneous nerve stimulators).  Call the office for questions about other devices. - Avoid electrical appliances that are in poor condition or are not properly grounded. - Microwave ovens are safe to be near or to operate.  Additional information for defibrillator patients should your device go off: - If your device goes off ONCE and you feel fine afterward, notify the device clinic nurses. - If your device goes off ONCE and you do not feel well afterward, call  911. - If your device goes off TWICE, call 911. - If your device goes off THREE times in one day, call 911.  DO NOT DRIVE YOURSELF OR A FAMILY MEMBER WITH A DEFIBRILLATOR TO THE HOSPITAL--CALL 911.

## 2018-09-25 ENCOUNTER — Ambulatory Visit (INDEPENDENT_AMBULATORY_CARE_PROVIDER_SITE_OTHER): Payer: Medicare Other | Admitting: Nurse Practitioner

## 2018-09-25 DIAGNOSIS — I5022 Chronic systolic (congestive) heart failure: Secondary | ICD-10-CM | POA: Diagnosis not present

## 2018-09-25 LAB — CUP PACEART INCLINIC DEVICE CHECK
Date Time Interrogation Session: 20200302102020
Implantable Lead Implant Date: 20200218
Implantable Lead Implant Date: 20200218
Implantable Lead Implant Date: 20200218
Implantable Lead Location: 753858
Implantable Lead Location: 753859
Implantable Lead Location: 753860
Implantable Pulse Generator Implant Date: 20200218
Pulse Gen Serial Number: 9880584

## 2018-09-25 NOTE — Progress Notes (Signed)
Wound check appointment. Steri-strips removed. Wound without redness or edema. Incision edges approximated, wound well healed. Normal device function. Thresholds, sensing, and impedances consistent with implant measurements. Device programmed at 3.5V for extra safety margin until 3 month visit. Histogram distribution appropriate for patient and level of activity. No mode switches or ventricular arrhythmias noted. Pt was only CRT pacing 58% of the time because of shorter AV delays than programmed paced AV delays. QuickOpt runt today. PAV changed fro 180 to 167msec, SAV changed from 160 to 78msec. LV offset changed from simultaneous to LV first by 30. Patient educated about wound care, arm mobility, lifting restrictions, shock plan. ROV in 3 months with implanting physician.

## 2018-10-25 ENCOUNTER — Other Ambulatory Visit: Payer: Self-pay | Admitting: *Deleted

## 2018-10-25 ENCOUNTER — Telehealth: Payer: Self-pay | Admitting: Cardiology

## 2018-10-25 NOTE — Telephone Encounter (Signed)
Returning call to [facility]

## 2018-10-25 NOTE — Telephone Encounter (Signed)
Pt verbally consented to telehealth phone appointment with Dr Harl Bowie 10/27/18 and that insurance would be billed for the encounter. Reviewed pt medications/allergies/pharmacy - pt has BP cuff and reminded to have this available prior to appt time.

## 2018-10-25 NOTE — Telephone Encounter (Signed)
Pt verbally consented to telehealth phone appt with Dr Harl Bowie and that insurance would be billed for the encounter. Pt medications/allergies/pharamcy reviewed. Pt has BP monitor at home.

## 2018-10-27 ENCOUNTER — Telehealth (INDEPENDENT_AMBULATORY_CARE_PROVIDER_SITE_OTHER): Payer: Medicare Other | Admitting: Cardiology

## 2018-10-27 ENCOUNTER — Encounter: Payer: Self-pay | Admitting: Cardiology

## 2018-10-27 VITALS — BP 124/70 | HR 70 | Ht 63.5 in | Wt 145.0 lb

## 2018-10-27 DIAGNOSIS — I2510 Atherosclerotic heart disease of native coronary artery without angina pectoris: Secondary | ICD-10-CM

## 2018-10-27 DIAGNOSIS — I5022 Chronic systolic (congestive) heart failure: Secondary | ICD-10-CM | POA: Diagnosis not present

## 2018-10-27 NOTE — Patient Instructions (Addendum)
Your physician recommends that you schedule a follow-up appointment in: 3 MONTHS WITH DR BRANCH  Your physician recommends that you continue on your current medications as directed. Please refer to the Current Medication list given to you today.  Thank you for choosing [facility]!!    

## 2018-10-27 NOTE — Progress Notes (Signed)
Virtual Visit via Telephone Note    Evaluation Performed:  Follow-up visit  This visit type was conducted due to national recommendations for restrictions regarding the COVID-19 Pandemic (e.g. social distancing).  This format is felt to be most appropriate for this patient at this time.  All issues noted in this document were discussed and addressed.  No physical exam was performed (except for noted visual exam findings with Video Visits).  Please refer to the patient's chart (MyChart message for video visits and phone note for telephone visits) for the patient's consent to telehealth for [facility].  Date:  10/27/2018   ID:  Kathleen Edwards, DOB 03-24-1946, MRN [id number]  Patient Location:  [place], [place]  Provider location:   [place], [place]  PCP:  Glenda Chroman, MD  Cardiologist:  Carlyle Dolly, MD  Electrophysiologist:  None   Chief Complaint:  4 month follow up appt  History of Present Illness:    Kathleen Edwards is a 73 y.o. female who presents via audio/video conferencing for a telehealth visit today.  Seen for the following medical problems.    1.Chronic systolic HF - admitted 10/18/69 withnew diagnosis of systolic HF. - from notes presented with hypertensive emergency, started on nitro gtt and diuresed - EKG LBBB,  - 12/2017 echo [facility]: LVEF 20% - 12/2017 cath with mild to moderate CAD. CI 3.2, PCWP 10, LVEDP 14. Overall normal CO and filling pressures - admit to [facility] previously  with low bp's and tachycardia to [facility] - Toprl was lowered to 25mg , entresto was continued.    Jan 2020 echo LVEF 20-25% - s/p BiV AICD placement 08/2018  - no recent SOB/DOE. No LE edema - compliant with meds   2. CAD - 12/2017 cath as reported below, overall mild to moderate disease - no recent symptoms  - no recent chest pain.    SH: works as IT trainer     The patient does not have symptoms concerning for COVID-19  infection (fever, chills, cough, or new shortness of breath).    Prior CV studies:   The following studies were reviewed today:  12/2017 cath  Dist RCA lesion is 55% stenosed.  Mid LM to Dist LM lesion is 20% stenosed.  Ost Cx lesion is 20% stenosed.  Prox LAD lesion is 30% stenosed.  Prox RCA lesion is 20% stenosed.  Mid RCA lesion is 30% stenosed.  There is evidence for diffuse coronary calcification involving all coronary arteries.  Left main stenosis with distal 20% narrowing; proximal irregularity of the LAD with 30% proximal stenosis; 20% ostial stenosis of the left circumflex vessel; and very large dominant RCA with mild to moderate calcification and irregularity with 20% proximal 30% mid and 50 to 60% smooth stenosis immediately proximal to the takeoff of a large PDA and PLA vessel with 20% PLA narrowing.  RECOMMENDATION: Guideline directed medical therapy for the patient's cardiomyopathy which most likely is of nonischemic etiology.  Jan 2020 echo Study Conclusions  - Procedure narrative: Transthoracic echocardiography. Image   quality was fair. The study was technically difficult, as a   result of poor acoustic windows and body habitus. - Left ventricle: The cavity size was normal. Wall thickness was   normal. Systolic function was severely reduced. The estimated   ejection fraction was in the range of 20% to 25%. Diffuse   hypokinesis. Doppler parameters are consistent with abnormal left   ventricular relaxation (grade 1 diastolic dysfunction). - Ventricular septum: Septal  motion showed abnormal function and   dyssynergy. These changes are consistent with a left bundle   Bunnie Rehberg block. - Mitral valve: Mildly to moderately calcified annulus. There was   mild regurgitation. - Left atrium: The atrium was mildly dilated. - Atrial septum: No defect or patent foramen ovale was identified. Past Medical History:  Diagnosis Date  . Acquired absence of both cervix  and uterus   . Anxiety disorder   . Asthma   . Degenerative joint disease of hand   . Diverticulosis   . Fatigue   . H/O: osteoarthritis   . History of vitamin A deficiency   . Hyperlipidemia   . Hypertension   . Keratosis, seborrheic   . Lipoma   . Ovarian failure   . Rhinitis, allergic   . Vitamin D deficiency    Past Surgical History:  Procedure Laterality Date  . BIV ICD INSERTION CRT-D N/A 09/12/2018   Procedure: BIV ICD INSERTION CRT-D;  Surgeon: Thompson Grayer, MD;  Location: [place] [facility];  Service: Cardiovascular;  Laterality: N/A;  . RIGHT/LEFT HEART CATH AND CORONARY ANGIOGRAPHY N/A 02/15/2018   Procedure: RIGHT/LEFT HEART CATH AND CORONARY ANGIOGRAPHY;  Surgeon: Troy Sine, MD;  Location: [place] [facility];  Service: Cardiovascular;  Laterality: N/A;  . TOTAL ABDOMINAL HYSTERECTOMY  1979     No outpatient medications have been marked as taking for the 10/27/18 encounter (Appointment) with Arnoldo Lenis, MD.     Allergies:   Lisinopril and Vioxx [rofecoxib]   Social History   Tobacco Use  . Smoking status: Former Smoker    Last attempt to quit: 08/21/1998    Years since quitting: 20.1  . Smokeless tobacco: Never Used  Substance Use Topics  . Alcohol use: Not Currently  . Drug use: Not Currently     Family Hx: The patient's family history includes Brain cancer in her father; CVA in her mother.  ROS:   Please see the history of present illness.     All other systems reviewed and are negative.   Labs/Other Tests and Data Reviewed:    Recent Labs: 08/28/2018: Hemoglobin 11.7; Platelets 311 09/13/2018: BUN 21; Creatinine, Ser 0.57; Potassium 3.4; Sodium 140   Recent Lipid Panel No results found for: CHOL, TRIG, HDL, CHOLHDL, LDLCALC, LDLDIRECT  Wt Readings from Last 3 Encounters:  09/13/18 144 lb (65.3 kg)  08/28/18 144 lb 3.2 oz (65.4 kg)  07/21/18 145 lb (65.8 kg)     Objective:    Vital Signs:  There were no vitals taken for this  visit.   Normal affect, normal speech patterns. Sounds comfortable, in no acute distress, pleasean  ASSESSMENT & PLAN:     1 Chronic systolic HF/NICM - on maximally tolerated therapy due to prior hypotension. Continues to do well without symptoms.  - BiV AICD followed by EP. 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  The importance of social distancing was discussed today.  Patient Risk:   After full review of this patient's clinical status, I feel that they are at least moderate risk at  this time.  Time:   Today, I have spent 22minutes with the patient with telehealth technology discussing the above medical problems. .     Medication Adjustments/Labs and Tests Ordered: Current medicines are reviewed at length with the patient today.  Concerns regarding medicines are outlined above.  Tests Ordered: No orders of the defined types were placed in this encounter.  Medication Changes: No orders of the defined types were placed in this encounter.   Disposition:  Follow up 3 months  Signed, Carlyle Dolly, MD  10/27/2018 8:01 AM    [place]

## 2018-11-30 ENCOUNTER — Other Ambulatory Visit: Payer: Self-pay | Admitting: Cardiology

## 2018-12-12 ENCOUNTER — Other Ambulatory Visit: Payer: Self-pay

## 2018-12-12 ENCOUNTER — Encounter: Payer: Medicare Other | Admitting: Internal Medicine

## 2018-12-12 ENCOUNTER — Ambulatory Visit (INDEPENDENT_AMBULATORY_CARE_PROVIDER_SITE_OTHER): Payer: Medicare Other | Admitting: *Deleted

## 2018-12-12 DIAGNOSIS — I428 Other cardiomyopathies: Secondary | ICD-10-CM

## 2018-12-13 LAB — CUP PACEART REMOTE DEVICE CHECK
Date Time Interrogation Session: 20200520082006
Implantable Lead Implant Date: 20200218
Implantable Lead Implant Date: 20200218
Implantable Lead Implant Date: 20200218
Implantable Lead Location: 753858
Implantable Lead Location: 753859
Implantable Lead Location: 753860
Implantable Pulse Generator Implant Date: 20200218
Pulse Gen Serial Number: 9880584

## 2018-12-21 ENCOUNTER — Encounter: Payer: Self-pay | Admitting: Cardiology

## 2018-12-21 NOTE — Progress Notes (Signed)
Remote ICD transmission.   

## 2019-01-03 ENCOUNTER — Telehealth: Payer: Self-pay | Admitting: *Deleted

## 2019-01-03 NOTE — Telephone Encounter (Signed)
The patient verbally consented for a telehealth phone visit with [facility] and understands that his/her insurance company will be billed for the encounter.   Medications reviewed.   Vitals - 134/82  72  145lb

## 2019-01-05 ENCOUNTER — Telehealth (INDEPENDENT_AMBULATORY_CARE_PROVIDER_SITE_OTHER): Payer: Medicare Other | Admitting: Internal Medicine

## 2019-01-05 VITALS — BP 138/85 | HR 60 | Wt 145.0 lb

## 2019-01-05 DIAGNOSIS — I428 Other cardiomyopathies: Secondary | ICD-10-CM

## 2019-01-05 DIAGNOSIS — I5022 Chronic systolic (congestive) heart failure: Secondary | ICD-10-CM | POA: Diagnosis not present

## 2019-01-05 NOTE — Progress Notes (Signed)
Electrophysiology TeleHealth Note   Due to national recommendations of social distancing due to [place] 19, an audio telehealth visit is felt to be most appropriate for this patient at this time.  Verbal consent was obtained by me for the telehealth visit today.  The patient does not have capability for a virtual visit.  A phone visit is therefore required today.   Date:  01/05/2019   ID:  Kathleen Edwards, DOB 04-05-1946, MRN [id number]  Location: patient's home  Provider location:  [facility]  Evaluation Performed: Follow-up visit  PCP:  Glenda Chroman, MD   Electrophysiologist:  Dr Rayann Heman  Chief Complaint:  ICD follow  History of Present Illness:    Kathleen Edwards is a 73 y.o. female who presents via telehealth conferencing today.  Since last being seen in our clinic, the patient reports doing very well.  Today, she denies symptoms of palpitations, chest pain, shortness of breath,  lower extremity edema, dizziness, presyncope, or syncope.  The patient is otherwise without complaint today.  The patient denies symptoms of fevers, chills, cough, or new SOB worrisome for COVID 19.  Past Medical History:  Diagnosis Date  . Acquired absence of both cervix and uterus   . Anxiety disorder   . Asthma   . Degenerative joint disease of hand   . Diverticulosis   . Fatigue   . H/O: osteoarthritis   . History of vitamin A deficiency   . Hyperlipidemia   . Hypertension   . Keratosis, seborrheic   . Lipoma   . Ovarian failure   . Rhinitis, allergic   . Vitamin D deficiency     Past Surgical History:  Procedure Laterality Date  . BIV ICD INSERTION CRT-D N/A 09/12/2018   Procedure: BIV ICD INSERTION CRT-D;  Surgeon: Thompson Grayer, MD;  Location: [place] [facility];  Service: Cardiovascular;  Laterality: N/A;  . RIGHT/LEFT HEART CATH AND CORONARY ANGIOGRAPHY N/A 02/15/2018   Procedure: RIGHT/LEFT HEART CATH AND CORONARY ANGIOGRAPHY;  Surgeon: Troy Sine, MD;  Location: [place] [facility];  Service: Cardiovascular;  Laterality: N/A;  . TOTAL ABDOMINAL HYSTERECTOMY  1979    Current Outpatient Medications  Medication Sig Dispense Refill  . aspirin EC 81 MG tablet Take 81 mg by mouth daily.    . Cholecalciferol (VITAMIN D PO) Take 1 capsule by mouth daily.    Marland Kitchen ENTRESTO 97-103 MG TAKE 1 TABLET BY MOUTH TWICE DAILY 60 tablet 6  . furosemide (LASIX) 20 MG tablet Take 1 tablet (20 mg total) by mouth daily. 90 tablet 3  . metoprolol succinate (TOPROL-XL) 25 MG 24 hr tablet Take 25 mg by mouth daily.    . potassium chloride (K-DUR) 10 MEQ tablet Take 10 mEq by mouth daily.    . pravastatin (PRAVACHOL) 40 MG tablet Take 40 mg by mouth daily.     No current facility-administered medications for this visit.     Allergies:   Lisinopril and Vioxx [rofecoxib]   Social History:  The patient  reports that she quit smoking about 20 years ago. She has never used smokeless tobacco. She reports previous alcohol use. She reports previous drug use.   Family History:  The patient's  family history includes Brain cancer in her father; CVA in her mother.   ROS:  Please see the history of present illness.   All other systems are personally reviewed and negative.    Exam:    Vital Signs:  BP 138/85  Pulse 60   Wt 145 lb (65.8 kg)   BMI 25.28 kg/m   Well sounding today   Labs/Other Tests and Data Reviewed:    Recent Labs: 08/28/2018: Hemoglobin 11.7; Platelets 311 09/13/2018: BUN 21; Creatinine, Ser 0.57; Potassium 3.4; Sodium 140   Wt Readings from Last 3 Encounters:  01/05/19 145 lb (65.8 kg)  10/27/18 145 lb (65.8 kg)  09/13/18 144 lb (65.3 kg)     Last device remote is reviewed from [place] PDF which reveals normal device function 4 NSVT episodes, <1% AF burden   ASSESSMENT & PLAN:    1.  Chronic systolic heart failure/NICM Doing well post CRTD implant Normal device function by remote transmission Will update echo in 3 months Will also need office visit  in 3 months to reprogram device to chronic outputs   2.  HTN Stable No change required today   Follow-up:  [place], echo in 3 months, office visit in 3 months for device reprogramming   Patient Risk:  after full review of this patients clinical status, I feel that they are at moderate risk at this time.  Today, I have spent 15 minutes with the patient with telehealth technology discussing arrhythmia management .    Army Fossa, MD  01/05/2019 11:24 AM     [address] [place]  [phone] (office) [phone] (fax)

## 2019-02-05 ENCOUNTER — Ambulatory Visit: Payer: Medicare Other | Admitting: Cardiology

## 2019-02-13 ENCOUNTER — Telehealth: Payer: Self-pay | Admitting: Cardiology

## 2019-02-13 NOTE — Telephone Encounter (Signed)

## 2019-02-13 NOTE — Progress Notes (Signed)
Cardiology Office Note:    Date:  02/14/2019   ID:  Kathleen Edwards, DOB Oct 04, 1945, MRN [id number]  PCP:  Glenda Chroman, MD  Cardiologist:  Carlyle Dolly, MD  Electrophysiologist: Thompson Grayer, MD   Referring MD: Glenda Chroman, MD   Chief Complaint  Patient presents with  . Follow-up    Heart failure    History of Present Illness:    Kathleen Edwards is a 73 y.o. female with a past medical history significant for chronic systolic heart failure/nonischemic cardiomyopathy s/p Bi V ICD 08/2018, hypertension, hyperlipidemia, asthma, anxiety disorder and vitamin D deficiency.  The patient was first noted to have systolic heart failure with EF 20% in 12/2017 when hospitalized at [facility].  She also had left bundle branch block on EKG and had presented with hypertensive emergency.  Cardiac cath in 01/2018 showed mild to moderate CAD and overall normal cardiac output and filling pressures.  She was started on medical therapy.  Echocardiogram in January 2020 showed EF 20-25%.  BiV ICD was placed by Dr. Rayann Heman for cardiac resynchronization therapy.  She was last seen by telehealth visit on 10/27/2018 by Dr. Harl Bowie at which time she was doing well. Today she is here for follow up. She is quite energetic. She Continues to work full time and reports being very active and has lots of energy.  She denies any chest discomfort, shortness of breath, orthopnea, PND, lightheadedness, syncope. She has occ mild pedal edema, "not bad".  She says that she never really had any symptoms prior to starting heart failure medications.  Patient works in housekeeping at [employer]. .  Past Medical History:  Diagnosis Date  . Acquired absence of both cervix and uterus   . Anxiety disorder   . Asthma   . Degenerative joint disease of hand   . Diverticulosis   . Fatigue   . H/O: osteoarthritis   . History of vitamin A deficiency   . Hyperlipidemia   . Hypertension   . Keratosis, seborrheic   .  Lipoma   . Ovarian failure   . Rhinitis, allergic   . Vitamin D deficiency     Past Surgical History:  Procedure Laterality Date  . BIV ICD INSERTION CRT-D N/A 09/12/2018   Procedure: BIV ICD INSERTION CRT-D;  Surgeon: Thompson Grayer, MD;  Location: [place] [facility];  Service: Cardiovascular;  Laterality: N/A;  . RIGHT/LEFT HEART CATH AND CORONARY ANGIOGRAPHY N/A 02/15/2018   Procedure: RIGHT/LEFT HEART CATH AND CORONARY ANGIOGRAPHY;  Surgeon: Troy Sine, MD;  Location: [place] [facility];  Service: Cardiovascular;  Laterality: N/A;  . TOTAL ABDOMINAL HYSTERECTOMY  1979    Current Medications: Current Meds  Medication Sig  . aspirin EC 81 MG tablet Take 81 mg by mouth daily.  . Cholecalciferol (VITAMIN D PO) Take 1 capsule by mouth daily.  Marland Kitchen ENTRESTO 97-103 MG TAKE 1 TABLET BY MOUTH TWICE DAILY  . furosemide (LASIX) 20 MG tablet Take 1 tablet (20 mg total) by mouth daily.  . metoprolol succinate (TOPROL-XL) 25 MG 24 hr tablet Take 25 mg by mouth daily.  . potassium chloride (K-DUR) 10 MEQ tablet Take 10 mEq by mouth daily.  . pravastatin (PRAVACHOL) 40 MG tablet Take 40 mg by mouth daily.  . [DISCONTINUED] furosemide (LASIX) 20 MG tablet Take 1 tablet (20 mg total) by mouth daily.     Allergies:   Lisinopril and Vioxx [rofecoxib]   Social History   Socioeconomic History  .  Marital status: Married    Spouse name: Not on file  . Number of children: Not on file  . Years of education: Not on file  . Highest education level: Not on file  Occupational History  . Not on file  Social Needs  . Financial resource strain: Not on file  . Food insecurity    Worry: Not on file    Inability: Not on file  . Transportation needs    Medical: Not on file    Non-medical: Not on file  Tobacco Use  . Smoking status: Former Smoker    Quit date: 08/21/1998    Years since quitting: 20.4  . Smokeless tobacco: Never Used  Substance and Sexual Activity  . Alcohol use: Not Currently  .  Drug use: Not Currently  . Sexual activity: Not on file  Lifestyle  . Physical activity    Days per week: Not on file    Minutes per session: Not on file  . Stress: Not on file  Relationships  . Social Herbalist on phone: Not on file    Gets together: Not on file    Attends religious service: Not on file    Active member of club or organization: Not on file    Attends meetings of clubs or organizations: Not on file    Relationship status: Not on file  Other Topics Concern  . Not on file  Social History Narrative   Lives with spouse in [place]   Works for [employer] in environmental services   Daughter works for [employer]     Family History: The patient's family history includes Brain cancer in her father; CVA in her mother. ROS:   Please see the history of present illness.     All other systems reviewed and are negative.  EKGs/Labs/Other Studies Reviewed:    The following studies were reviewed today:  Echo 07/27/2018: EF 20-25%, diffuse hypokinesis, grade 1 diastolic dysfunction, mild MR  Echo 01/17/2018: EF less than 56%, grade 1 diastolic dysfunction, severe global LV hypokinesis  RIGHT/LEFT HEART CATH AND CORONARY ANGIOGRAPHY 02/15/2018  Conclusion   Dist RCA lesion is 55% stenosed.  Mid LM to Dist LM lesion is 20% stenosed.  Ost Cx lesion is 20% stenosed.  Prox LAD lesion is 30% stenosed.  Prox RCA lesion is 20% stenosed.  Mid RCA lesion is 30% stenosed.   There is evidence for diffuse coronary calcification involving all coronary arteries.  Left main stenosis with distal 20% narrowing; proximal irregularity of the LAD with 30% proximal stenosis; 20% ostial stenosis of the left circumflex vessel; and very large dominant RCA with mild to moderate calcification and irregularity with 20% proximal 30% mid and 50 to 60% smooth stenosis immediately proximal to the takeoff of a large PDA and PLA vessel with 20% PLA narrowing.   RECOMMENDATION: Guideline directed medical therapy for the patient's cardiomyopathy which most likely is of nonischemic etiology.  Recommend Aspirin 81mg  daily for moderate CAD.     EKG:  EKG is not ordered today.    Recent Labs: 08/28/2018: Hemoglobin 11.7; Platelets 311 09/13/2018: BUN 21; Creatinine, Ser 0.57; Potassium 3.4; Sodium 140   Recent Lipid Panel No results found for: CHOL, TRIG, HDL, CHOLHDL, VLDL, LDLCALC, LDLDIRECT  Physical Exam:    VS:  BP 114/71   Pulse 60   Ht 5\' 3"  (1.6 m)   Wt 146 lb 12.8 oz (66.6 kg)   SpO2 98% Comment: on room air  BMI 26.00 kg/m     Wt Readings from Last 3 Encounters:  02/14/19 146 lb 12.8 oz (66.6 kg)  01/05/19 145 lb (65.8 kg)  10/27/18 145 lb (65.8 kg)     Physical Exam  Constitutional: She is oriented to person, place, and time. She appears well-developed and well-nourished. No distress.  HENT:  Head: Normocephalic and atraumatic.  Neck: Normal range of motion. Neck supple. No JVD present. No tracheal deviation present. No thyromegaly present.  Cardiovascular: Normal rate, regular rhythm, normal heart sounds and intact distal pulses. Exam reveals no gallop and no friction rub.  No murmur heard. Pulmonary/Chest: Effort normal and breath sounds normal. No respiratory distress. She has no wheezes. She has no rales.  Abdominal: Soft. Bowel sounds are normal.  Musculoskeletal: Normal range of motion.        General: No edema.  Neurological: She is alert and oriented to person, place, and time.  Skin: Skin is warm and dry.  Psychiatric: She has a normal mood and affect. Her behavior is normal. Judgment and thought content normal.  Vitals reviewed.    ASSESSMENT:    1. Chronic systolic heart failure ([place])   2. Essential (primary) hypertension   3. Hyperlipidemia, unspecified hyperlipidemia type   4. ICD (implantable cardioverter-defibrillator) in place   5. Coronary artery disease involving native coronary artery of native  heart without angina pectoris    PLAN:    In order of problems listed above:  1.  Chronic systolic heart failure/nonischemic cardiomyopathy: On medical therapy including Entresto 97-103 mg twice daily, Lasix 20 mg daily, Toprol-XL 25 mg daily.  Cardiac resynchronization therapy via Bi V ICD placed in 08/2018.  Patient is doing very well, tolerating medications, having no heart failure symptoms.  She is very active, still working full-time.  2.  Hypertension: BP well controlled. Follows low sodium diet.  We will update basic metabolic panel today  3.  Hyperlipidemia: On pravastatin 40 mg daily. Lipids followed by PCP.    4.  Status post BiV ICD: Followed by Dr. Rayann Heman.  Plan for repeat echo in 3 months per Dr. Rayann Heman.  Planning office visit in about September to reprogram device to chronic outputs.  5.  CAD: Mild to moderate disease on cath in 12/2017.  On aspirin and statin. No anginal symptoms.    Medication Adjustments/Labs and Tests Ordered: Current medicines are reviewed at length with the patient today.  Concerns regarding medicines are outlined above. Labs and tests ordered and medication changes are outlined in the patient instructions below:  Patient Instructions  Your physician wants you to follow-up in: [place] will receive a reminder letter in the mail two months in advance. If you don't receive a letter, please call our office to schedule the follow-up appointment.  Your physician recommends that you continue on your current medications as directed. Please refer to the Current Medication list given to you today.  Your physician recommends that you return for lab work BMP - [place] DO NOT NEED TO BE FASTING  Thank you for choosing [facility]!!       Signed, Daune Perch, NP  02/14/2019 9:32 AM    [place]

## 2019-02-14 ENCOUNTER — Encounter: Payer: Self-pay | Admitting: Cardiology

## 2019-02-14 ENCOUNTER — Other Ambulatory Visit: Payer: Self-pay

## 2019-02-14 ENCOUNTER — Ambulatory Visit: Payer: Medicare Other | Admitting: Cardiology

## 2019-02-14 VITALS — BP 114/71 | HR 60 | Ht 63.0 in | Wt 146.8 lb

## 2019-02-14 DIAGNOSIS — I2510 Atherosclerotic heart disease of native coronary artery without angina pectoris: Secondary | ICD-10-CM

## 2019-02-14 DIAGNOSIS — I5022 Chronic systolic (congestive) heart failure: Secondary | ICD-10-CM

## 2019-02-14 DIAGNOSIS — E785 Hyperlipidemia, unspecified: Secondary | ICD-10-CM | POA: Diagnosis not present

## 2019-02-14 DIAGNOSIS — Z95810 Presence of automatic (implantable) cardiac defibrillator: Secondary | ICD-10-CM

## 2019-02-14 DIAGNOSIS — I10 Essential (primary) hypertension: Secondary | ICD-10-CM | POA: Diagnosis not present

## 2019-02-14 MED ORDER — FUROSEMIDE 20 MG PO TABS: 20.0000 mg | ORAL_TABLET | Freq: Every day | ORAL | 1 refills | Status: DC

## 2019-02-14 NOTE — Patient Instructions (Signed)
Your physician wants you to follow-up in: [place] will receive a reminder letter in the mail two months in advance. If you don't receive a letter, please call our office to schedule the follow-up appointment.  Your physician recommends that you continue on your current medications as directed. Please refer to the Current Medication list given to you today.  Your physician recommends that you return for lab work BMP - [place] DO NOT NEED TO BE FASTING  Thank you for choosing [facility]!!

## 2019-03-13 ENCOUNTER — Encounter: Payer: Self-pay | Admitting: *Deleted

## 2019-03-13 ENCOUNTER — Ambulatory Visit (INDEPENDENT_AMBULATORY_CARE_PROVIDER_SITE_OTHER): Payer: Medicare Other | Admitting: *Deleted

## 2019-03-13 DIAGNOSIS — I5022 Chronic systolic (congestive) heart failure: Secondary | ICD-10-CM

## 2019-03-13 DIAGNOSIS — I428 Other cardiomyopathies: Secondary | ICD-10-CM | POA: Diagnosis not present

## 2019-03-13 LAB — CUP PACEART REMOTE DEVICE CHECK
Date Time Interrogation Session: 20200818132510
Implantable Lead Implant Date: 20200218
Implantable Lead Implant Date: 20200218
Implantable Lead Implant Date: 20200218
Implantable Lead Location: 753858
Implantable Lead Location: 753859
Implantable Lead Location: 753860
Implantable Pulse Generator Implant Date: 20200218
Pulse Gen Serial Number: 9880584

## 2019-03-15 ENCOUNTER — Other Ambulatory Visit: Payer: Self-pay

## 2019-03-15 ENCOUNTER — Encounter: Payer: Self-pay | Admitting: Cardiology

## 2019-03-15 ENCOUNTER — Telehealth (INDEPENDENT_AMBULATORY_CARE_PROVIDER_SITE_OTHER): Payer: Medicare Other | Admitting: Cardiology

## 2019-03-15 VITALS — BP 133/78 | HR 60 | Ht 63.5 in | Wt 148.0 lb

## 2019-03-15 DIAGNOSIS — I480 Paroxysmal atrial fibrillation: Secondary | ICD-10-CM | POA: Diagnosis not present

## 2019-03-15 MED ORDER — METOPROLOL SUCCINATE ER 25 MG PO TB24: 37.5000 mg | ORAL_TABLET | Freq: Every day | ORAL | 3 refills | Status: DC

## 2019-03-15 MED ORDER — APIXABAN 5 MG PO TABS: 5.0000 mg | ORAL_TABLET | Freq: Two times a day (BID) | ORAL | 6 refills | Status: DC

## 2019-03-15 NOTE — Patient Instructions (Signed)
Medication Instructions:  INCREASE METOPROLOL XL TO 37.5 MD DAILY   STOP ASPIRIN  START ELIQUIS 5 MG - TWO TIMES DAILY   Labwork: NONE  Testing/Procedures: NONE  Follow-Up: Your physician recommends that you schedule a follow-up appointment in: ALREADY SCHEDULED    Any Other Special Instructions Will Be Listed Below (If Applicable).     If you need a refill on your cardiac medications before your next appointment, please call your pharmacy.

## 2019-03-15 NOTE — Progress Notes (Signed)
Virtual Visit via Telephone Note   This visit type was conducted due to national recommendations for restrictions regarding the COVID-19 Pandemic (e.g. social distancing) in an effort to limit this patient's exposure and mitigate transmission in our community.  Due to her co-morbid illnesses, this patient is at least at moderate risk for complications without adequate follow up.  This format is felt to be most appropriate for this patient at this time.  The patient did not have access to video technology/had technical difficulties with video requiring transitioning to audio format only (telephone).  All issues noted in this document were discussed and addressed.  No physical exam could be performed with this format.  Please refer to the patient's chart for her  consent to telehealth for [facility].   Date:  03/15/2019   ID:  Kathleen Edwards, DOB [birth year], MRN [id number]  Patient Location: Home Provider Location: Office  PCP:  Glenda Chroman, MD  Cardiologist:  Carlyle Dolly, MD  Electrophysiologist:  Thompson Grayer, MD   Evaluation Performed:  Follow-Up Visit  Chief Complaint:  Arrhythmia  History of Present Illness:    Kathleen Edwards is a 73 y.o. female seen today for a focused visit for recent afib noted on her ICD checks.   1.Chronic systolic HF - admitted 0/27/25 withnew diagnosis of systolic HF. - from notes presented with hypertensive emergency, started on nitro gtt and diuresed - EKG LBBB,  - 12/2017 echo [facility]: LVEF 20% - 12/2017 cath with mild to moderate CAD. CI 3.2, PCWP 10, LVEDP 14. Overall normal CO and filling pressures  Jan 2020 echo: LVEF 20-25%.   08/2018 BiV AICD placed, followed by EP  - no recent SOB/DOE. Marland Kitchen No LE edema.     2. PAF - new diagnosis, noted on last few device checks 11/2018 and 02/2019 - CHADS2Vasc score is 5 (CHF, HTN, age x1, CAD,gender)  - she denies any significant palpitations.    SH: works as Theatre stage manager    The patient does not have symptoms concerning for COVID-19 infection (fever, chills, cough, or new shortness of breath).    Past Medical History:  Diagnosis Date  . Acquired absence of both cervix and uterus   . Anxiety disorder   . Asthma   . Degenerative joint disease of hand   . Diverticulosis   . Fatigue   . H/O: osteoarthritis   . History of vitamin A deficiency   . Hyperlipidemia   . Hypertension   . Keratosis, seborrheic   . Lipoma   . Ovarian failure   . Rhinitis, allergic   . Vitamin D deficiency    Past Surgical History:  Procedure Laterality Date  . BIV ICD INSERTION CRT-D N/A 09/12/2018   Procedure: BIV ICD INSERTION CRT-D;  Surgeon: Thompson Grayer, MD;  Location: [place] [facility];  Service: Cardiovascular;  Laterality: N/A;  . RIGHT/LEFT HEART CATH AND CORONARY ANGIOGRAPHY N/A 02/15/2018   Procedure: RIGHT/LEFT HEART CATH AND CORONARY ANGIOGRAPHY;  Surgeon: Troy Sine, MD;  Location: [place] [facility];  Service: Cardiovascular;  Laterality: N/A;  . TOTAL ABDOMINAL HYSTERECTOMY  1979     No outpatient medications have been marked as taking for the 03/15/19 encounter (Appointment) with Arnoldo Lenis, MD.     Allergies:   Lisinopril and Vioxx [rofecoxib]   Social History   Tobacco Use  . Smoking status: Former Smoker    Quit date: 08/21/1998    Years since quitting: 20.5  .  Smokeless tobacco: Never Used  Substance Use Topics  . Alcohol use: Not Currently  . Drug use: Not Currently     Family Hx: The patient's family history includes Brain cancer in her father; CVA in her mother.  ROS:   Please see the history of present illness.     All other systems reviewed and are negative.   Prior CV studies:   The following studies were reviewed today:  12/2017 cath  Dist RCA lesion is 55% stenosed.  Mid LM to Dist LM lesion is 20% stenosed.  Ost Cx lesion is 20% stenosed.  Prox LAD lesion is 30% stenosed.  Prox RCA lesion  is 20% stenosed.  Mid RCA lesion is 30% stenosed.  There is evidence for diffuse coronary calcification involving all coronary arteries.  Left main stenosis with distal 20% narrowing; proximal irregularity of the LAD with 30% proximal stenosis; 20% ostial stenosis of the left circumflex vessel; and very large dominant RCA with mild to moderate calcification and irregularity with 20% proximal 30% mid and 50 to 60% smooth stenosis immediately proximal to the takeoff of a large PDA and PLA vessel with 20% PLA narrowing.  RECOMMENDATION: Guideline directed medical therapy for the patient's cardiomyopathy which most likely is of nonischemic etiology.   Jan 2020 echo Study Conclusions  - Procedure narrative: Transthoracic echocardiography. Image   quality was fair. The study was technically difficult, as a   result of poor acoustic windows and body habitus. - Left ventricle: The cavity size was normal. Wall thickness was   normal. Systolic function was severely reduced. The estimated   ejection fraction was in the range of 20% to 25%. Diffuse   hypokinesis. Doppler parameters are consistent with abnormal left   ventricular relaxation (grade 1 diastolic dysfunction). - Ventricular septum: Septal motion showed abnormal function and   dyssynergy. These changes are consistent with a left bundle   Karthikeya Funke block. - Mitral valve: Mildly to moderately calcified annulus. There was   mild regurgitation. - Left atrium: The atrium was mildly dilated. - Atrial septum: No defect or patent foramen ovale was identified.   Labs/Other Tests and Data Reviewed:    EKG:  No ECG reviewed.  Recent Labs: 08/28/2018: Hemoglobin 11.7; Platelets 311 09/13/2018: BUN 21; Creatinine, Ser 0.57; Potassium 3.4; Sodium 140   Recent Lipid Panel No results found for: CHOL, TRIG, HDL, CHOLHDL, LDLCALC, LDLDIRECT  Wt Readings from Last 3 Encounters:  02/14/19 146 lb 12.8 oz (66.6 kg)  01/05/19 145 lb (65.8 kg)   10/27/18 145 lb (65.8 kg)     Objective:    Vital Signs:  Today's Vitals   03/15/19 1330  BP: 133/78  Pulse: 60  Weight: 148 lb (67.1 kg)  Height: 5' 3.5" (1.613 m)   Body mass index is 25.81 kg/m.  Normal affect. Normal speech pattern and tone. Comfortable, no apparent distress. NO audible signs of SOB or wheezing.   ASSESSMENT & PLAN:    1 PAF - new diagnosis, episodes noted on device checks - CHADS2Vasc score is 5, we will start eliquis 5mg  bid. Stop ASA. Increase toprol to 37.5mg  daily due to elevated rates at times   COVID-19 Education: The signs and symptoms of COVID-19 were discussed with the patient and how to seek care for testing (follow up with PCP or arrange E-visit).  The importance of social distancing was discussed today.  Time:   Today, I have spent 14 minutes with the patient with telehealth technology discussing the above problems.  Medication Adjustments/Labs and Tests Ordered: Current medicines are reviewed at length with the patient today.  Concerns regarding medicines are outlined above.   Tests Ordered: No orders of the defined types were placed in this encounter.   Medication Changes: No orders of the defined types were placed in this encounter.   Follow Up:  Virtual Visit in 3 month(s)  Signed, Carlyle Dolly, MD  03/15/2019 12:02 PM    [place]

## 2019-03-21 NOTE — Progress Notes (Signed)
Remote ICD transmission.   

## 2019-03-30 ENCOUNTER — Ambulatory Visit (INDEPENDENT_AMBULATORY_CARE_PROVIDER_SITE_OTHER): Payer: Medicare Other | Admitting: Internal Medicine

## 2019-03-30 ENCOUNTER — Encounter: Payer: Self-pay | Admitting: Internal Medicine

## 2019-03-30 ENCOUNTER — Other Ambulatory Visit: Payer: Self-pay

## 2019-03-30 VITALS — BP 120/72 | HR 60 | Ht 64.5 in | Wt 150.0 lb

## 2019-03-30 DIAGNOSIS — I480 Paroxysmal atrial fibrillation: Secondary | ICD-10-CM

## 2019-03-30 DIAGNOSIS — I428 Other cardiomyopathies: Secondary | ICD-10-CM | POA: Diagnosis not present

## 2019-03-30 DIAGNOSIS — I5022 Chronic systolic (congestive) heart failure: Secondary | ICD-10-CM

## 2019-03-30 LAB — CUP PACEART INCLINIC DEVICE CHECK
Date Time Interrogation Session: 20200904085752
Implantable Lead Implant Date: 20200218
Implantable Lead Implant Date: 20200218
Implantable Lead Implant Date: 20200218
Implantable Lead Location: 753858
Implantable Lead Location: 753859
Implantable Lead Location: 753860
Implantable Pulse Generator Implant Date: 20200218
Pulse Gen Serial Number: 9880584

## 2019-03-30 NOTE — Progress Notes (Signed)
PCP: Glenda Chroman, MD Primary Cardiologist:  Dr Harl Bowie Primary EP: Dr Rayann Heman  CC: CHF  NAIROBY Edwards is a 73 y.o. female who presents today for routine electrophysiology followup.  Since last being seen in our clinic, the patient reports doing very well.  She is pleased with results of CRT.  Energy is better.  Still active and working.  + mild edema.  Today, she denies symptoms of palpitations, chest pain, shortness of breath,  dizziness, presyncope, syncope, or ICD shocks.  The patient is otherwise without complaint today.   Past Medical History:  Diagnosis Date  . Acquired absence of both cervix and uterus   . Anxiety disorder   . Asthma   . Degenerative joint disease of hand   . Diverticulosis   . Fatigue   . H/O: osteoarthritis   . History of vitamin A deficiency   . Hyperlipidemia   . Hypertension   . Keratosis, seborrheic   . Lipoma   . Ovarian failure   . Rhinitis, allergic   . Vitamin D deficiency    Past Surgical History:  Procedure Laterality Date  . BIV ICD INSERTION CRT-D N/A 09/12/2018   Procedure: BIV ICD INSERTION CRT-D;  Surgeon: Thompson Grayer, MD;  Location: [place] [facility];  Service: Cardiovascular;  Laterality: N/A;  . RIGHT/LEFT HEART CATH AND CORONARY ANGIOGRAPHY N/A 02/15/2018   Procedure: RIGHT/LEFT HEART CATH AND CORONARY ANGIOGRAPHY;  Surgeon: Troy Sine, MD;  Location: [place] [facility];  Service: Cardiovascular;  Laterality: N/A;  . TOTAL ABDOMINAL HYSTERECTOMY  1979    ROS- all systems are reviewed and negative except as per HPI above  Current Outpatient Medications  Medication Sig Dispense Refill  . apixaban (ELIQUIS) 5 MG TABS tablet Take 1 tablet (5 mg total) by mouth 2 (two) times daily. 60 tablet 6  . Cholecalciferol (VITAMIN D PO) Take 1 capsule by mouth daily.    Marland Kitchen ENTRESTO 97-103 MG TAKE 1 TABLET BY MOUTH TWICE DAILY 60 tablet 6  . furosemide (LASIX) 20 MG tablet Take 1 tablet (20 mg total) by mouth daily. 90 tablet 1  .  metoprolol succinate (TOPROL-XL) 25 MG 24 hr tablet Take 1.5 tablets (37.5 mg total) by mouth daily. 135 tablet 3  . potassium chloride (K-DUR) 10 MEQ tablet Take 10 mEq by mouth daily.    . pravastatin (PRAVACHOL) 40 MG tablet Take 40 mg by mouth daily.     No current facility-administered medications for this visit.     Physical Exam: Vitals:   03/30/19 0836  BP: 120/72  Pulse: 60  SpO2: 97%  Weight: 150 lb (68 kg)  Height: 5' 4.5" (1.638 m)    GEN- The patient is well appearing, alert and oriented x 3 today.   Head- normocephalic, atraumatic Eyes-  Sclera clear, conjunctiva pink Ears- hearing intact Oropharynx- clear Lungs- Clear to ausculation bilaterally, normal work of breathing Chest- ICD pocket is well healed Heart- Regular rate and rhythm, no murmurs, rubs or gallops, PMI not laterally displaced GI- soft, NT, ND, + BS Extremities- no clubbing, cyanosis, +1 edema  ICD interrogation- reviewed in detail today,  See PACEART report  ekg tracing 09/13/18- sinus with BiV pacing  Wt Readings from Last 3 Encounters:  03/30/19 150 lb (68 kg)  03/15/19 148 lb (67.1 kg)  02/14/19 146 lb 12.8 oz (66.6 kg)    Assessment and Plan:  1.  Chronic systolic dysfunction/ non ischemic CM euvolemic today Stable on an appropriate medical  regimen Normal ICD function See Claudia Desanctis Art report No changes today she is not device dependant today I will enroll in ICM device clinic Echo is scheduled to follow-up on response to CRT  2. Paroxysmal atrial fibrillation Low burden  Started on anticoagulation by Dr Dorthula Nettles MD, [facility] 03/30/2019 9:22 AM

## 2019-03-30 NOTE — Patient Instructions (Signed)
Medication Instructions:  Continue all current medications.  Labwork: none  Testing/Procedures: none  Follow-Up: 1 year - Dr.  Allred   Any Other Special Instructions Will Be Listed Below (If Applicable).   If you need a refill on your cardiac medications before your next appointment, please call your pharmacy.  

## 2019-04-03 ENCOUNTER — Telehealth: Payer: Self-pay

## 2019-04-03 NOTE — Telephone Encounter (Signed)
Patient referred to [facility] clinic by Dr Rayann Heman.  Spoke with patient and provided ICM intro.  She agreed to monthly calls for ICM follow up.  She is feeling fine at this time.  Reviewed HF symptoms to report and encouraged to call if she experiences any symptoms.  ICM remote transmission scheduled for 05/14/2019.

## 2019-04-05 ENCOUNTER — Other Ambulatory Visit: Payer: Self-pay

## 2019-04-05 ENCOUNTER — Ambulatory Visit (INDEPENDENT_AMBULATORY_CARE_PROVIDER_SITE_OTHER): Payer: Medicare Other

## 2019-04-05 DIAGNOSIS — I5022 Chronic systolic (congestive) heart failure: Secondary | ICD-10-CM | POA: Diagnosis not present

## 2019-04-05 DIAGNOSIS — I428 Other cardiomyopathies: Secondary | ICD-10-CM | POA: Diagnosis not present

## 2019-05-09 ENCOUNTER — Ambulatory Visit (INDEPENDENT_AMBULATORY_CARE_PROVIDER_SITE_OTHER): Payer: Medicare Other

## 2019-05-09 DIAGNOSIS — Z95810 Presence of automatic (implantable) cardiac defibrillator: Secondary | ICD-10-CM

## 2019-05-09 DIAGNOSIS — I5022 Chronic systolic (congestive) heart failure: Secondary | ICD-10-CM | POA: Diagnosis not present

## 2019-05-11 NOTE — Progress Notes (Signed)
EPIC Encounter for ICM Monitoring  Patient Name: Kathleen Edwards is a 73 y.o. female Date: 05/11/2019 Primary Care Physican: Glenda Chroman, MD Primary Cardiologist: Branch Electrophysiologist: Allred Bi-V Pacing:   99% 05/11/2019 Weight: 150 lbs       1st ICM remote transmission.  Heart Failure questions reviewed.  Pt asymptomatic.   Report: Thoracic impedance normal.   Prescribed: Furosemide 20 mg take 1 tablet daily.  Recommendations:  No changes and encouraged to call if experiencing any fluid symptoms.  Follow-up plan: ICM clinic phone appointment on 06/13/2019.   91 day device clinic remote transmission 06/12/2019.    Copy of ICM check sent to Dr. Rayann Heman.   3 month ICM trend: 05/09/2019    1 Year ICM trend:       Rosalene Billings, RN 05/11/2019 1:45 PM

## 2019-06-12 ENCOUNTER — Ambulatory Visit (INDEPENDENT_AMBULATORY_CARE_PROVIDER_SITE_OTHER): Payer: Medicare Other | Admitting: *Deleted

## 2019-06-12 DIAGNOSIS — I428 Other cardiomyopathies: Secondary | ICD-10-CM

## 2019-06-12 DIAGNOSIS — I5022 Chronic systolic (congestive) heart failure: Secondary | ICD-10-CM

## 2019-06-13 ENCOUNTER — Ambulatory Visit (INDEPENDENT_AMBULATORY_CARE_PROVIDER_SITE_OTHER): Payer: Medicare Other

## 2019-06-13 DIAGNOSIS — I5022 Chronic systolic (congestive) heart failure: Secondary | ICD-10-CM

## 2019-06-13 DIAGNOSIS — Z95810 Presence of automatic (implantable) cardiac defibrillator: Secondary | ICD-10-CM

## 2019-06-13 LAB — CUP PACEART REMOTE DEVICE CHECK
Date Time Interrogation Session: 20201118053509
Implantable Lead Implant Date: 20200218
Implantable Lead Implant Date: 20200218
Implantable Lead Implant Date: 20200218
Implantable Lead Location: 753858
Implantable Lead Location: 753859
Implantable Lead Location: 753860
Implantable Pulse Generator Implant Date: 20200218
Pulse Gen Serial Number: 9880584

## 2019-06-15 NOTE — Progress Notes (Signed)
EPIC Encounter for ICM Monitoring  Patient Name: Kathleen Edwards is a 73 y.o. female Date: 06/15/2019 Primary Care Physican: Glenda Chroman, MD Primary Cardiologist: Branch Electrophysiologist: Allred Bi-V Pacing:   99% 06/15/2019 Weight: 150 lbs                                                           Heart Failure questions reviewed.  Pt asymptomatic for fluid accumulation during decreased impedance.  She eats popcorn with salt and encouraged to decrease salt intake.    Report: Thoracic impedance normal but was suggestive of possible fluid accumulation from 10/22-10/27 and 11/13 - 11/15.  Prescribed: Furosemide 20 mg take 1 tablet daily.  Recommendations:  Reinforced limiting salt intake to < 2000 mg daily.  Encouraged to call if experiencing fluid symptoms.  Follow-up plan: ICM clinic phone appointment on 07/23/2019.   91 day device clinic remote transmission 09/11/2019.    Copy of ICM check sent to Dr. Rayann Heman.   3 month ICM trend: 06/12/2019    1 Year ICM trend:       Rosalene Billings, RN 06/15/2019 4:54 PM

## 2019-07-11 NOTE — Progress Notes (Signed)
Remote ICD transmission.   

## 2019-07-23 ENCOUNTER — Ambulatory Visit (INDEPENDENT_AMBULATORY_CARE_PROVIDER_SITE_OTHER): Payer: Medicare Other

## 2019-07-23 DIAGNOSIS — I5022 Chronic systolic (congestive) heart failure: Secondary | ICD-10-CM

## 2019-07-23 DIAGNOSIS — Z95810 Presence of automatic (implantable) cardiac defibrillator: Secondary | ICD-10-CM | POA: Diagnosis not present

## 2019-07-24 NOTE — Progress Notes (Signed)
EPIC Encounter for ICM Monitoring  Patient Name: Kathleen Edwards is a 73 y.o. female Date: 07/24/2019 Primary Care Physican: Glenda Chroman, MD Primary Cardiologist:Branch Electrophysiologist:Allred Bi-V Pacing:>99% 11/20/2020Weight: 150lbs   Heart Failure questions reviewed and she is feeling fine.   Report: Thoracic impedance normal  Prescribed: Furosemide20 mg take 1 tablet daily.  Recommendations:  No changes and encouraged to call if experiencing any fluid symptoms.  Follow-up plan: ICM clinic phone appointment on 08/27/2019.   91 day device clinic remote transmission 09/11/2019.    Copy of ICM check sent to Dr. Rayann Heman.    3 month ICM trend: 07/23/2019    1 Year ICM trend:       Rosalene Billings, RN 07/24/2019 11:27 AM

## 2019-08-08 ENCOUNTER — Other Ambulatory Visit: Payer: Self-pay | Admitting: Cardiology

## 2019-08-15 DIAGNOSIS — Z0000 Encounter for general adult medical examination without abnormal findings: Secondary | ICD-10-CM | POA: Diagnosis not present

## 2019-08-15 DIAGNOSIS — E7800 Pure hypercholesterolemia, unspecified: Secondary | ICD-10-CM | POA: Diagnosis not present

## 2019-08-15 DIAGNOSIS — R5383 Other fatigue: Secondary | ICD-10-CM | POA: Diagnosis not present

## 2019-08-15 DIAGNOSIS — I10 Essential (primary) hypertension: Secondary | ICD-10-CM | POA: Diagnosis not present

## 2019-08-15 DIAGNOSIS — Z1211 Encounter for screening for malignant neoplasm of colon: Secondary | ICD-10-CM | POA: Diagnosis not present

## 2019-08-15 DIAGNOSIS — Z299 Encounter for prophylactic measures, unspecified: Secondary | ICD-10-CM | POA: Diagnosis not present

## 2019-08-15 DIAGNOSIS — Z7189 Other specified counseling: Secondary | ICD-10-CM | POA: Diagnosis not present

## 2019-08-15 DIAGNOSIS — Z79899 Other long term (current) drug therapy: Secondary | ICD-10-CM | POA: Diagnosis not present

## 2019-08-27 ENCOUNTER — Ambulatory Visit (INDEPENDENT_AMBULATORY_CARE_PROVIDER_SITE_OTHER): Payer: Medicare Other

## 2019-08-27 DIAGNOSIS — Z95810 Presence of automatic (implantable) cardiac defibrillator: Secondary | ICD-10-CM

## 2019-08-27 DIAGNOSIS — I5022 Chronic systolic (congestive) heart failure: Secondary | ICD-10-CM | POA: Diagnosis not present

## 2019-08-28 NOTE — Progress Notes (Signed)
EPIC Encounter for ICM Monitoring  Patient Name: Kathleen Edwards is a 74 y.o. female Date: 08/28/2019 Primary Care Physican: Glenda Chroman, MD Primary Cardiologist:Branch Electrophysiologist:Allred Bi-V Pacing:>99% 2/2/2021Weight: 147lbs   Heart Failure questions reviewed and she is feeling fine. Took 2nd COVID vaccine on 08/27/2019 without any side effects.  CorVue thoracic impedance normal  Prescribed: Furosemide20 mg take 1 tablet daily.  Recommendations:No changes and encouraged to call if experiencing any fluid symptoms.  Follow-up plan: ICM clinic phone appointment on3/02/2020. 91 day device clinic remote transmission 09/11/2019. Telehealth visit with Dr Harl Bowie 08/31/2019.  Copy of ICM check sent to Dr.Allred.   3 month ICM trend: 08/27/2019    1 Year ICM trend:       Rosalene Billings, RN 08/28/2019 2:38 PM

## 2019-08-31 ENCOUNTER — Encounter: Payer: Self-pay | Admitting: *Deleted

## 2019-08-31 ENCOUNTER — Encounter: Payer: Self-pay | Admitting: Cardiology

## 2019-08-31 ENCOUNTER — Telehealth (INDEPENDENT_AMBULATORY_CARE_PROVIDER_SITE_OTHER): Payer: Medicare Other | Admitting: Cardiology

## 2019-08-31 VITALS — BP 148/64 | HR 68 | Ht 64.0 in | Wt 147.0 lb

## 2019-08-31 DIAGNOSIS — I5022 Chronic systolic (congestive) heart failure: Secondary | ICD-10-CM | POA: Diagnosis not present

## 2019-08-31 DIAGNOSIS — I110 Hypertensive heart disease with heart failure: Secondary | ICD-10-CM | POA: Diagnosis not present

## 2019-08-31 DIAGNOSIS — I2510 Atherosclerotic heart disease of native coronary artery without angina pectoris: Secondary | ICD-10-CM

## 2019-08-31 DIAGNOSIS — I480 Paroxysmal atrial fibrillation: Secondary | ICD-10-CM | POA: Diagnosis not present

## 2019-08-31 NOTE — Progress Notes (Signed)
Virtual Visit via Telephone Note   This visit type was conducted due to national recommendations for restrictions regarding the COVID-19 Pandemic (e.g. social distancing) in an effort to limit this patient's exposure and mitigate transmission in our community.  Due to her co-morbid illnesses, this patient is at least at moderate risk for complications without adequate follow up.  This format is felt to be most appropriate for this patient at this time.  The patient did not have access to video technology/had technical difficulties with video requiring transitioning to audio format only (telephone).  All issues noted in this document were discussed and addressed.  No physical exam could be performed with this format.  Please refer to the patient's chart for her  consent to telehealth for [facility].   Date:  08/31/2019   ID:  Kathleen Edwards, DOB January 20, 1946, MRN [id number]  Patient Location: Home Provider Location: Office  PCP:  Glenda Chroman, MD  Cardiologist:  Carlyle Dolly, MD  Electrophysiologist:  Thompson Grayer, MD   Evaluation Performed:  Follow-Up Visit  Chief Complaint:  Follow up visit  History of Present Illness:    Kathleen Edwards is a 74 y.o. female seen today for follow up fo the following medical problems.   1.Chronic systolic HF now with normalized LVEF - admitted 01/17/18 withnew diagnosis of systolic HF. - from notes presented with hypertensive emergency, started on nitro gtt and diuresed - EKG LBBB,  - 12/2017 echo [facility]: LVEF 20% - 12/2017 cath with mild to moderate CAD. CI 3.2, PCWP 10, LVEDP 14. Overall normal CO and filling pressures Jan 2020 echo: LVEF 20-25%.   08/2018 BiV AICD placed, followed by EP  03/2019 echo: LVEF 55-60%,   - no recent SOB/DOE. No LE edema - compliant with meds    2. PAF - new diagnosis, noted on last few device checks 11/2018 and 02/2019 - CHADS2Vasc score is 5 (CHF, HTN, age x1, CAD,gender)  - no recent  palpitations - no bleeding on eliquis.    3. 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   The patient does not have symptoms concerning for COVID-19 infection (fever, chills, cough, or new shortness of breath).    Past Medical History:  Diagnosis Date  . Acquired absence of both cervix and uterus   . Anxiety disorder   . Asthma   . Degenerative joint disease of hand   . Diverticulosis   . Fatigue   . H/O: osteoarthritis   . History of vitamin A deficiency   . Hyperlipidemia   . Hypertension   . Keratosis, seborrheic   . Lipoma   . Ovarian failure   . Rhinitis, allergic   . Vitamin D deficiency    Past Surgical History:  Procedure Laterality Date  . BIV ICD INSERTION CRT-D N/A 09/12/2018   Procedure: BIV ICD INSERTION CRT-D;  Surgeon: Thompson Grayer, MD;  Location: [place] [facility];  Service: Cardiovascular;  Laterality: N/A;  . RIGHT/LEFT HEART CATH AND CORONARY ANGIOGRAPHY N/A 02/15/2018   Procedure: RIGHT/LEFT HEART CATH AND CORONARY ANGIOGRAPHY;  Surgeon: Troy Sine, MD;  Location: [place] [facility];  Service: Cardiovascular;  Laterality: N/A;  . TOTAL ABDOMINAL HYSTERECTOMY  1979     No outpatient medications have been marked as taking for the 08/31/19 encounter (Appointment) with Arnoldo Lenis, MD.  Allergies:   Lisinopril and Vioxx [rofecoxib]   Social History   Tobacco Use  . Smoking status: Former Smoker    Quit date: 08/21/1998    Years since quitting: 21.0  . Smokeless tobacco: Never Used  Substance Use Topics  . Alcohol use: Not Currently  . Drug use: Not Currently     Family Hx: The patient's family history includes Brain cancer in her father; CVA in her mother.  ROS:   Please see the history of present illness.     All other systems reviewed and are negative.   Prior CV studies:   The following  studies were reviewed today:  12/2017 cath  Dist RCA lesion is 55% stenosed.  Mid LM to Dist LM lesion is 20% stenosed.  Ost Cx lesion is 20% stenosed.  Prox LAD lesion is 30% stenosed.  Prox RCA lesion is 20% stenosed.  Mid RCA lesion is 30% stenosed.  There is evidence for diffuse coronary calcification involving all coronary arteries.  Left main stenosis with distal 20% narrowing; proximal irregularity of the LAD with 30% proximal stenosis; 20% ostial stenosis of the left circumflex vessel; and very large dominant RCA with mild to moderate calcification and irregularity with 20% proximal 30% mid and 50 to 60% smooth stenosis immediately proximal to the takeoff of a large PDA and PLA vessel with 20% PLA narrowing.  RECOMMENDATION: Guideline directed medical therapy for the patient's cardiomyopathy which most likely is of nonischemic etiology.   Jan 2020 echo Study Conclusions  - Procedure narrative: Transthoracic echocardiography. Image quality was fair. The study was technically difficult, as a result of poor acoustic windows and body habitus. - Left ventricle: The cavity size was normal. Wall thickness was normal. Systolic function was severely reduced. The estimated ejection fraction was in the range of 20% to 25%. Diffuse hypokinesis. Doppler parameters are consistent with abnormal left ventricular relaxation (grade 1 diastolic dysfunction). - Ventricular septum: Septal motion showed abnormal function and dyssynergy. These changes are consistent with a left bundle Elaine Middleton block. - Mitral valve: Mildly to moderately calcified annulus. There was mild regurgitation. - Left atrium: The atrium was mildly dilated. - Atrial septum: No defect or patent foramen ovale was identified.  Labs/Other Tests and Data Reviewed:    EKG:  No ECG reviewed.  Recent Labs: 09/13/2018: BUN 21; Creatinine, Ser 0.57; Potassium 3.4; Sodium 140   Recent Lipid Panel No  results found for: CHOL, TRIG, HDL, CHOLHDL, LDLCALC, LDLDIRECT  Wt Readings from Last 3 Encounters:  03/30/19 150 lb (68 kg)  03/15/19 148 lb (67.1 kg)  02/14/19 146 lb 12.8 oz (66.6 kg)     Objective:    Vital Signs:   Today's Vitals   08/31/19 0816  BP: (!) 148/64  Pulse: 68  Weight: 147 lb (66.7 kg)  Height: 5\' 4"  (1.626 m)   Body mass index is 25.23 kg/m. Normal affect. Normal speech pattern and tone. Comfortable, no apaprent distress. No audible signs of SOB or wheezing.   ASSESSMENT & PLAN:    1 Chronic systolic HF - no recent symptoms - LVEF has most recently normalized - continue current meds, EP to continue to monitor ICD  2. PAF - continue current meds including eliquis, no symptoms   3. CAD - no symptoms, continue current therapy.   4. Elevated blood pressure - high today, from last visit was at goal. She checked during a short break at work, will monitor trends at this time.   COVID-19 Education: The  signs and symptoms of COVID-19 were discussed with the patient and how to seek care for testing (follow up with PCP or arrange E-visit).  The importance of social distancing was discussed today.  Time:   Today, I have spent 20 minutes with the patient with telehealth technology discussing the above problems.     Medication Adjustments/Labs and Tests Ordered: Current medicines are reviewed at length with the patient today.  Concerns regarding medicines are outlined above.   Tests Ordered: No orders of the defined types were placed in this encounter.   Medication Changes: No orders of the defined types were placed in this encounter.   Follow Up:  Either In Person or Virtual in 6 month(s)  Signed, Carlyle Dolly, MD  08/31/2019 8:12 AM    [place]

## 2019-08-31 NOTE — Patient Instructions (Signed)

## 2019-09-10 ENCOUNTER — Telehealth: Payer: Self-pay | Admitting: *Deleted

## 2019-09-10 LAB — CUP PACEART REMOTE DEVICE CHECK
Battery Remaining Longevity: 71 mo
Battery Remaining Percentage: 86 %
Battery Voltage: 3.05 V
Brady Statistic AP VP Percent: 25 %
Brady Statistic AP VS Percent: 1 %
Brady Statistic AS VP Percent: 74 %
Brady Statistic AS VS Percent: 1 %
Brady Statistic RA Percent Paced: 24 %
Date Time Interrogation Session: 20210215020017
HighPow Impedance: 65 Ohm
HighPow Impedance: 65 Ohm
Implantable Lead Implant Date: 20200218
Implantable Lead Implant Date: 20200218
Implantable Lead Implant Date: 20200218
Implantable Lead Location: 753858
Implantable Lead Location: 753859
Implantable Lead Location: 753860
Implantable Pulse Generator Implant Date: 20200218
Lead Channel Impedance Value: 1075 Ohm
Lead Channel Impedance Value: 410 Ohm
Lead Channel Impedance Value: 650 Ohm
Lead Channel Pacing Threshold Amplitude: 0.5 V
Lead Channel Pacing Threshold Amplitude: 0.875 V
Lead Channel Pacing Threshold Amplitude: 2.75 V
Lead Channel Pacing Threshold Pulse Width: 0.4 ms
Lead Channel Pacing Threshold Pulse Width: 0.5 ms
Lead Channel Pacing Threshold Pulse Width: 0.5 ms
Lead Channel Sensing Intrinsic Amplitude: 1.4 mV
Lead Channel Sensing Intrinsic Amplitude: 11.4 mV
Lead Channel Setting Pacing Amplitude: 1.875
Lead Channel Setting Pacing Amplitude: 2.5 V
Lead Channel Setting Pacing Amplitude: 3.25 V
Lead Channel Setting Pacing Pulse Width: 0.4 ms
Lead Channel Setting Pacing Pulse Width: 0.5 ms
Lead Channel Setting Sensing Sensitivity: 0.5 mV
Pulse Gen Serial Number: 9880584

## 2019-09-10 NOTE — Telephone Encounter (Signed)
Stop losartan, continue entresto   Zandra Abts MD

## 2019-09-10 NOTE — Telephone Encounter (Signed)
Pt voiced understanding and will stop losartan and continue Entresto

## 2019-09-10 NOTE — Telephone Encounter (Signed)
[facility] says pt has been taking both losartan (Dr Woody Seller) and Delene Loll - wanted to make Dr Harl Bowie aware of this - this pt needs to stop losartan correct ?

## 2019-09-11 ENCOUNTER — Ambulatory Visit (INDEPENDENT_AMBULATORY_CARE_PROVIDER_SITE_OTHER): Payer: Medicare Other | Admitting: *Deleted

## 2019-09-11 DIAGNOSIS — I428 Other cardiomyopathies: Secondary | ICD-10-CM | POA: Diagnosis not present

## 2019-09-11 NOTE — Progress Notes (Signed)
ICD Remote  

## 2019-10-01 ENCOUNTER — Ambulatory Visit (INDEPENDENT_AMBULATORY_CARE_PROVIDER_SITE_OTHER): Payer: Medicare Other

## 2019-10-01 DIAGNOSIS — Z95810 Presence of automatic (implantable) cardiac defibrillator: Secondary | ICD-10-CM

## 2019-10-01 DIAGNOSIS — I5022 Chronic systolic (congestive) heart failure: Secondary | ICD-10-CM | POA: Diagnosis not present

## 2019-10-02 NOTE — Progress Notes (Signed)
EPIC Encounter for ICM Monitoring  Patient Name: Kathleen Edwards is a 74 y.o. female Date: 10/02/2019 Primary Care Physican: Glenda Chroman, MD Primary Cardiologist:Branch Electrophysiologist:Allred Bi-V Pacing:>99% 2/2/2021Weight: 147lbs   Attempted call to patient and unable to reach.  Left detailed message per DPR regarding transmission. Transmission reviewed.   CorVue thoracic impedance normal  Prescribed: Furosemide20 mg take 1 tablet daily.  Recommendations:Left voice mail with ICM number and encouraged to call if experiencing any fluid symptoms.  Follow-up plan: ICM clinic phone appointment on4/06/2020. 91 day device clinic remote transmission 12/11/2019. Office visit with Dr Harl Bowie 03/03/2020 and 03/28/2020 with Dr Rayann Heman.  Copy of ICM check sent to Dr.Allred.  3 month ICM trend: 10/01/2019    1 Year ICM trend:       Rosalene Billings, RN 10/02/2019 3:33 PM

## 2019-10-14 ENCOUNTER — Other Ambulatory Visit: Payer: Self-pay | Admitting: Cardiology

## 2019-11-05 ENCOUNTER — Ambulatory Visit (INDEPENDENT_AMBULATORY_CARE_PROVIDER_SITE_OTHER): Payer: Medicare Other

## 2019-11-05 DIAGNOSIS — Z95810 Presence of automatic (implantable) cardiac defibrillator: Secondary | ICD-10-CM | POA: Diagnosis not present

## 2019-11-05 DIAGNOSIS — I5022 Chronic systolic (congestive) heart failure: Secondary | ICD-10-CM | POA: Diagnosis not present

## 2019-11-06 ENCOUNTER — Telehealth: Payer: Self-pay

## 2019-11-06 NOTE — Progress Notes (Signed)
EPIC Encounter for ICM Monitoring  Patient Name: Kathleen Edwards is a 74 y.o. female Date: 11/06/2019 Primary Care Physican: Glenda Chroman, MD Primary Cardiologist:Branch Electrophysiologist:Allred Bi-V Pacing:99% 2/2/2021Weight: 147lbs   Attempted call to patient and unable to reach.  Left detailed message per DPR regarding transmission. Transmission reviewed.   CorVue thoracic impedance normal  Prescribed: Furosemide20 mg take 1 tablet daily.  Recommendations:Left voice mail with ICM number and encouraged to call if experiencing any fluid symptoms.  Follow-up plan: ICM clinic phone appointment on5/19/2021. 91 day device clinic remote transmission 12/11/2019. Office visit with Dr Harl Bowie 03/03/2020 and 03/28/2020 with Dr Rayann Heman.  Copy of ICM check sent to Dr.Allred.  3 month ICM trend: 11/05/2019    1 Year ICM trend:       Rosalene Billings, RN 11/06/2019 11:22 AM

## 2019-11-06 NOTE — Telephone Encounter (Signed)
Remote ICM transmission received.  Attempted call to patient regarding ICM remote transmission and left detailed message per DPR.  Advised to return call for any fluid symptoms or questions. Next ICM remote transmission scheduled 12/11/2019.     

## 2019-11-15 DIAGNOSIS — I5020 Unspecified systolic (congestive) heart failure: Secondary | ICD-10-CM | POA: Diagnosis not present

## 2019-11-15 DIAGNOSIS — I429 Cardiomyopathy, unspecified: Secondary | ICD-10-CM | POA: Diagnosis not present

## 2019-11-15 DIAGNOSIS — Z299 Encounter for prophylactic measures, unspecified: Secondary | ICD-10-CM | POA: Diagnosis not present

## 2019-11-15 DIAGNOSIS — I10 Essential (primary) hypertension: Secondary | ICD-10-CM | POA: Diagnosis not present

## 2019-12-11 ENCOUNTER — Ambulatory Visit (INDEPENDENT_AMBULATORY_CARE_PROVIDER_SITE_OTHER): Payer: Medicare Other | Admitting: *Deleted

## 2019-12-11 DIAGNOSIS — I428 Other cardiomyopathies: Secondary | ICD-10-CM

## 2019-12-11 DIAGNOSIS — I5022 Chronic systolic (congestive) heart failure: Secondary | ICD-10-CM

## 2019-12-11 LAB — CUP PACEART REMOTE DEVICE CHECK
Battery Remaining Longevity: 70 mo
Battery Remaining Percentage: 84 %
Battery Voltage: 3.02 V
Brady Statistic AP VP Percent: 25 %
Brady Statistic AP VS Percent: 1 %
Brady Statistic AS VP Percent: 74 %
Brady Statistic AS VS Percent: 1 %
Brady Statistic RA Percent Paced: 24 %
Date Time Interrogation Session: 20210518020015
HighPow Impedance: 66 Ohm
HighPow Impedance: 66 Ohm
Implantable Lead Implant Date: 20200218
Implantable Lead Implant Date: 20200218
Implantable Lead Implant Date: 20200218
Implantable Lead Location: 753858
Implantable Lead Location: 753859
Implantable Lead Location: 753860
Implantable Pulse Generator Implant Date: 20200218
Lead Channel Impedance Value: 1125 Ohm
Lead Channel Impedance Value: 430 Ohm
Lead Channel Impedance Value: 640 Ohm
Lead Channel Pacing Threshold Amplitude: 0.5 V
Lead Channel Pacing Threshold Amplitude: 1.5 V
Lead Channel Pacing Threshold Amplitude: 2.625 V
Lead Channel Pacing Threshold Pulse Width: 0.4 ms
Lead Channel Pacing Threshold Pulse Width: 0.5 ms
Lead Channel Pacing Threshold Pulse Width: 0.5 ms
Lead Channel Sensing Intrinsic Amplitude: 1.7 mV
Lead Channel Sensing Intrinsic Amplitude: 11.4 mV
Lead Channel Setting Pacing Amplitude: 2.5 V
Lead Channel Setting Pacing Amplitude: 2.5 V
Lead Channel Setting Pacing Amplitude: 3.125
Lead Channel Setting Pacing Pulse Width: 0.4 ms
Lead Channel Setting Pacing Pulse Width: 0.5 ms
Lead Channel Setting Sensing Sensitivity: 0.5 mV
Pulse Gen Serial Number: 9880584

## 2019-12-12 ENCOUNTER — Ambulatory Visit (INDEPENDENT_AMBULATORY_CARE_PROVIDER_SITE_OTHER): Payer: Medicare Other

## 2019-12-12 DIAGNOSIS — Z95810 Presence of automatic (implantable) cardiac defibrillator: Secondary | ICD-10-CM

## 2019-12-12 DIAGNOSIS — I5022 Chronic systolic (congestive) heart failure: Secondary | ICD-10-CM | POA: Diagnosis not present

## 2019-12-13 NOTE — Progress Notes (Signed)
Remote ICD transmission.   

## 2019-12-14 ENCOUNTER — Telehealth: Payer: Self-pay

## 2019-12-14 NOTE — Telephone Encounter (Signed)
Remote ICM transmission received.  Attempted call to patient regarding ICM remote transmission and left detailed message per DPR.  Advised to return call for any fluid symptoms or questions. Next ICM remote transmission scheduled 01/14/2020.     

## 2019-12-14 NOTE — Progress Notes (Signed)
EPIC Encounter for ICM Monitoring  Patient Name: Kathleen Edwards is a 74 y.o. female Date: 12/14/2019 Primary Care Physican: Glenda Chroman, MD Primary Cardiologist:Branch Electrophysiologist:Allred Bi-V Pacing:99% LastWeight: 147lbs  AT/AF Burden: <1% (taking Eliquis)   Attempted call to patient and unable to reach.  Left detailed message per DPR regarding transmission. Transmission reviewed.   CorVue thoracic impedance normal  Prescribed: Furosemide20 mg take 1 tablet daily.  Recommendations:Left voice mail with ICM number and encouraged to call if experiencing any fluid symptoms.  Follow-up plan: ICM clinic phone appointment on6/21/2021. 91 day device clinic remote transmission8/17/2021. Officevisit with Dr Branch8/03/2020.  Copy of ICM check sent to Dr.Allred.  3 month ICM trend: 12/12/2019    1 Year ICM trend:       Rosalene Billings, RN 12/14/2019 7:41 AM

## 2020-01-14 ENCOUNTER — Ambulatory Visit (INDEPENDENT_AMBULATORY_CARE_PROVIDER_SITE_OTHER): Payer: Medicare Other

## 2020-01-14 DIAGNOSIS — Z95810 Presence of automatic (implantable) cardiac defibrillator: Secondary | ICD-10-CM

## 2020-01-14 DIAGNOSIS — I5022 Chronic systolic (congestive) heart failure: Secondary | ICD-10-CM | POA: Diagnosis not present

## 2020-01-15 ENCOUNTER — Telehealth: Payer: Self-pay

## 2020-01-15 NOTE — Telephone Encounter (Signed)
Remote ICM transmission received.  Attempted call to patient regarding ICM remote transmission and left detailed message per DPR. Adviised to return call for any fluid symptoms or questions. Next ICM remote transmission scheduled 01/23/2020.

## 2020-01-15 NOTE — Progress Notes (Signed)
EPIC Encounter for ICM Monitoring  Patient Name: Kathleen Edwards is a 74 y.o. female Date: 01/15/2020 Primary Care Physican: Glenda Chroman, MD Primary Cardiologist:Branch Electrophysiologist:Allred Bi-V Pacing:99% LastWeight: 147lbs  AT/AF Burden: <1% (taking Eliquis)   Attempted call to patient and unable to reach.  Left detailed message per DPR regarding transmission. Transmission reviewed.   CorVue thoracic impedance suggesting fluid accumulation from 5/28-6/8 and 6/13-6/20  Prescribed:  Furosemide20 mg take 1 tablet daily. Potassium 10 mEq take 1 tablet daily  Labs: 08/16/2019 Creatinine 0.75, BUN 19, Potassium 3.9, Sodium 142, GFR 79-91 A complete set of results can be found in Results Review.  Recommendations:Left voice mail with ICM number and encouraged to call if experiencing any fluid symptoms.  Follow-up plan: ICM clinic phone appointment on6/30/2021 to recheck fluid levels. 91 day device clinic remote transmission8/17/2021. Officevisit with Dr Branch8/03/2020.  Copy of ICM check sent to Dr.Allred.  3 month ICM trend: 01/14/2020    1 Year ICM trend:       Rosalene Billings, RN 01/15/2020 9:24 AM

## 2020-01-23 ENCOUNTER — Ambulatory Visit: Payer: Medicare Other

## 2020-01-23 DIAGNOSIS — Z95810 Presence of automatic (implantable) cardiac defibrillator: Secondary | ICD-10-CM

## 2020-01-23 DIAGNOSIS — I5022 Chronic systolic (congestive) heart failure: Secondary | ICD-10-CM

## 2020-01-25 NOTE — Progress Notes (Signed)
EPIC Encounter for ICM Monitoring  Patient Name: Kathleen Edwards is a 74 y.o. female Date: 01/25/2020 Primary Care Physican: Glenda Chroman, MD Primary Cardiologist:Branch Electrophysiologist:Allred Bi-V Pacing:99% LastWeight: 147lbs  AT/AF Burden: <1% (taking Eliquis)   Transmission reviewed.   CorVue thoracic impedance returned to normal  Prescribed:  Furosemide20 mg take 1 tablet daily. Potassium 10 mEq take 1 tablet daily  Labs: 08/16/2019 Creatinine 0.75, BUN 19, Potassium 3.9, Sodium 142, GFR 79-91 A complete set of results can be found in Results Review.  Recommendations:None  Follow-up plan: ICM clinic phone appointment on7/26/2021. 91 day device clinic remote transmission8/17/2021.   EP/Cardiology Office Visits: 03/03/2020 with Dr. Harl Bowie.    Copy of ICM check sent to Dr. Rayann Heman.   3 month ICM trend: 01/23/2020      Rosalene Billings, RN 01/25/2020 12:09 PM

## 2020-01-26 ENCOUNTER — Other Ambulatory Visit: Payer: Self-pay | Admitting: Cardiology

## 2020-02-18 ENCOUNTER — Ambulatory Visit (INDEPENDENT_AMBULATORY_CARE_PROVIDER_SITE_OTHER): Payer: Medicare Other

## 2020-02-18 DIAGNOSIS — I5022 Chronic systolic (congestive) heart failure: Secondary | ICD-10-CM

## 2020-02-18 DIAGNOSIS — Z95810 Presence of automatic (implantable) cardiac defibrillator: Secondary | ICD-10-CM | POA: Diagnosis not present

## 2020-02-20 ENCOUNTER — Telehealth: Payer: Self-pay

## 2020-02-20 NOTE — Telephone Encounter (Signed)
Remote ICM transmission received.  Attempted call to patient regarding ICM remote transmission and left detailed message per DPR.  Advised to return call for any fluid symptoms or questions. Next ICM remote transmission scheduled 03/24/2020.   ° °

## 2020-02-20 NOTE — Progress Notes (Signed)
EPIC Encounter for ICM Monitoring  Patient Name: Kathleen Edwards is a 74 y.o. female Date: 02/20/2020 Primary Care Physican: Glenda Chroman, MD Primary Cardiologist:Branch Electrophysiologist:Allred Bi-V Pacing:99% LastWeight: 147lbs  AT/AF Burden: <1% (taking Eliquis)   Attempted call to patient and unable to reach.  Left detailed message per DPR regarding transmission. Transmission reviewed.   CorVue thoracic impedancenormal but was suggesting possible fluid accumulation from 7/15-7/18 and 7/21-7/24.  Prescribed:  Furosemide20 mg take 1 tablet daily. Potassium 10 mEq take 1 tablet daily  Labs: 08/16/2019 Creatinine0.75, BUN19, Potassium3.9, Sodium142, [id number] A complete set of results can be found in Results Review.  Recommendations: Left voice mail with ICM number and encouraged to call if experiencing any fluid symptoms.  Follow-up plan: ICM clinic phone appointment on8/30/2021. 91 day device clinic remote transmission8/17/2021.   EP/Cardiology Office Visits: 03/03/2020 with Dr. Harl Bowie and 03/28/2020 with Dr Rayann Heman.    Copy of ICM check sent to Dr. Rayann Heman.   3 month ICM trend: 02/18/2020    1 Year ICM trend:       Rosalene Billings, RN 02/20/2020 5:07 PM

## 2020-02-23 ENCOUNTER — Other Ambulatory Visit: Payer: Self-pay | Admitting: Cardiology

## 2020-02-25 ENCOUNTER — Other Ambulatory Visit: Payer: Self-pay | Admitting: Cardiology

## 2020-03-03 ENCOUNTER — Ambulatory Visit: Payer: Medicare Other | Admitting: Cardiology

## 2020-03-03 ENCOUNTER — Encounter: Payer: Self-pay | Admitting: Cardiology

## 2020-03-03 ENCOUNTER — Encounter: Payer: Self-pay | Admitting: *Deleted

## 2020-03-03 VITALS — BP 120/60 | HR 67 | Ht 64.0 in | Wt 146.0 lb

## 2020-03-03 DIAGNOSIS — I5022 Chronic systolic (congestive) heart failure: Secondary | ICD-10-CM

## 2020-03-03 NOTE — Patient Instructions (Signed)

## 2020-03-03 NOTE — Progress Notes (Signed)
Clinical Summary Kathleen Edwards is a 74 y.o.female seen today for follow up fo the following medical problems.   1.Chronic systolic HF now with normalized LVEF - admitted 01/17/18 withnew diagnosis of systolic HF. - from notes presented with hypertensive emergency, started on nitro gtt and diuresed - EKG LBBB,  - 12/2017 echo [facility]: LVEF 20% - 12/2017 cath with mild to moderate CAD. CI 3.2, PCWP 10, LVEDP 14. Overall normal CO and filling pressures Jan 2020 echo: LVEF 20-25%.  08/2018 BiV AICD placed, followed by EP  03/2019 echo: LVEF 55-60%,     - no SOB/DOE. No recent LE edema - compliant with meds - 11/2019 normval ICD check.    2.PAF -  noted device checks 11/2018 and 02/2019 - CHADS2Vasc score is 5 (CHF, HTN, age x1, CAD,gender)  -no recent palpitations - no bleeding on eliquis.   3. CAD - 12/2017 cath as reported below, overall mild to moderate disease -denies any chest pain.     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  Has had covid vaccine.    Past Medical History:  Diagnosis Date  . Acquired absence of both cervix and uterus   . Anxiety disorder   . Asthma   . Degenerative joint disease of hand   . Diverticulosis   . Fatigue   . H/O: osteoarthritis   . History of vitamin A deficiency   . Hyperlipidemia   . Hypertension   . Keratosis, seborrheic   . Lipoma   . Ovarian failure   . Rhinitis, allergic   . Vitamin D deficiency      Allergies  Allergen Reactions  . Lisinopril Cough  . Vioxx [Rofecoxib] Other (See Comments)    Epigastric discomfort      Current Outpatient Medications  Medication Sig Dispense Refill  . ELIQUIS 5 MG TABS tablet TAKE 1 TABLET BY MOUTH TWICE DAILY 60 tablet 6  . Cholecalciferol (VITAMIN D PO) Take 1 capsule by mouth daily.    Marland Kitchen ENTRESTO 97-103 MG TAKE 1 TABLET BY MOUTH TWICE DAILY 60 tablet 6  . furosemide (LASIX) 20 MG tablet TAKE 1 TABLET BY MOUTH DAILY 90  tablet 1  . metoprolol succinate (TOPROL-XL) 25 MG 24 hr tablet TAKE 1 AND 1/2 TABLETS BY MOUTH DAILY 135 tablet 3  . potassium chloride (K-DUR) 10 MEQ tablet Take 10 mEq by mouth daily.    . pravastatin (PRAVACHOL) 40 MG tablet Take 40 mg by mouth daily.     No current facility-administered medications for this visit.     Past Surgical History:  Procedure Laterality Date  . BIV ICD INSERTION CRT-D N/A 09/12/2018   Procedure: BIV ICD INSERTION CRT-D;  Surgeon: Thompson Grayer, MD;  Location: [place] [facility];  Service: Cardiovascular;  Laterality: N/A;  . RIGHT/LEFT HEART CATH AND CORONARY ANGIOGRAPHY N/A 02/15/2018   Procedure: RIGHT/LEFT HEART CATH AND CORONARY ANGIOGRAPHY;  Surgeon: Troy Sine, MD;  Location: [place] [facility];  Service: Cardiovascular;  Laterality: N/A;  . TOTAL ABDOMINAL HYSTERECTOMY  1979     Allergies  Allergen Reactions  . Lisinopril Cough  . Vioxx [Rofecoxib] Other (See Comments)    Epigastric discomfort       Family History  Problem Relation Age of Onset  . CVA Mother   . Brain cancer Father      Social History Ms. Chestnutt reports that she quit smoking about 21 years ago. She has never used smokeless  tobacco. Ms. Bordwell reports previous alcohol use.   Review of Systems CONSTITUTIONAL: No weight loss, fever, chills, weakness or fatigue.  HEENT: Eyes: No visual loss, blurred vision, double vision or yellow sclerae.No hearing loss, sneezing, congestion, runny nose or sore throat.  SKIN: No rash or itching.  CARDIOVASCULAR: per hpi RESPIRATORY: No shortness of breath, cough or sputum.  GASTROINTESTINAL: No anorexia, nausea, vomiting or diarrhea. No abdominal pain or blood.  GENITOURINARY: No burning on urination, no polyuria NEUROLOGICAL: No headache, dizziness, syncope, paralysis, ataxia, numbness or tingling in the extremities. No change in bowel or bladder control.  MUSCULOSKELETAL: No muscle, back pain, joint pain or stiffness.    LYMPHATICS: No enlarged nodes. No history of splenectomy.  PSYCHIATRIC: No history of depression or anxiety.  ENDOCRINOLOGIC: No reports of sweating, cold or heat intolerance. No polyuria or polydipsia.  Marland Kitchen   Physical Examination Today's Vitals   03/03/20 1419  BP: 120/60  Pulse: 67  SpO2: 97%  Weight: 146 lb (66.2 kg)  Height: 5\' 4"  (1.626 m)   Body mass index is 25.06 kg/m.  Gen: resting comfortably, no acute distress HEENT: no scleral icterus, pupils equal round and reactive, no palptable cervical adenopathy,  CV: RRR, no m/r/g, no jvd Resp: Clear to auscultation bilaterally GI: abdomen is soft, non-tender, non-distended, normal bowel sounds, no hepatosplenomegaly MSK: extremities are warm, no edema.  Skin: warm, no rash Neuro:  no focal deficits Psych: appropriate affect   Diagnostic Studies  12/2017 cath  Dist RCA lesion is 55% stenosed.  Mid LM to Dist LM lesion is 20% stenosed.  Ost Cx lesion is 20% stenosed.  Prox LAD lesion is 30% stenosed.  Prox RCA lesion is 20% stenosed.  Mid RCA lesion is 30% stenosed.  There is evidence for diffuse coronary calcification involving all coronary arteries.  Left main stenosis with distal 20% narrowing; proximal irregularity of the LAD with 30% proximal stenosis; 20% ostial stenosis of the left circumflex vessel; and very large dominant RCA with mild to moderate calcification and irregularity with 20% proximal 30% mid and 50 to 60% smooth stenosis immediately proximal to the takeoff of a large PDA and PLA vessel with 20% PLA narrowing.  RECOMMENDATION: Guideline directed medical therapy for the patient's cardiomyopathy which most likely is of nonischemic etiology.   Jan 2020 echo Study Conclusions  - Procedure narrative: Transthoracic echocardiography. Image quality was fair. The study was technically difficult, as a result of poor acoustic windows and body habitus. - Left ventricle: The cavity size was  normal. Wall thickness was normal. Systolic function was severely reduced. The estimated ejection fraction was in the range of 20% to 25%. Diffuse hypokinesis. Doppler parameters are consistent with abnormal left ventricular relaxation (grade 1 diastolic dysfunction). - Ventricular septum: Septal motion showed abnormal function and dyssynergy. These changes are consistent with a left bundle Kathleen Edwards block. - Mitral valve: Mildly to moderately calcified annulus. There was mild regurgitation. - Left atrium: The atrium was mildly dilated. - Atrial septum: No defect or patent foramen ovale was identified.   Assessment and Plan  1Chronic systolic HF - LVEF has most recently normalized - no symptoms, continue current meds  2. PAF - no symptoms, continue current meds including anticoag.  - EKG today show A sensed V paced.   3. CAD - no recent symptoms, continue to monitor.         Arnoldo Lenis, M.D.

## 2020-03-11 ENCOUNTER — Ambulatory Visit (INDEPENDENT_AMBULATORY_CARE_PROVIDER_SITE_OTHER): Payer: Medicare Other | Admitting: *Deleted

## 2020-03-11 DIAGNOSIS — I428 Other cardiomyopathies: Secondary | ICD-10-CM | POA: Diagnosis not present

## 2020-03-11 LAB — CUP PACEART REMOTE DEVICE CHECK
Battery Remaining Longevity: 66 mo
Battery Remaining Percentage: 80 %
Battery Voltage: 3.01 V
Brady Statistic AP VP Percent: 24 %
Brady Statistic AP VS Percent: 1 %
Brady Statistic AS VP Percent: 75 %
Brady Statistic AS VS Percent: 1 %
Brady Statistic RA Percent Paced: 23 %
Date Time Interrogation Session: 20210817020018
HighPow Impedance: 66 Ohm
HighPow Impedance: 66 Ohm
Implantable Lead Implant Date: 20200218
Implantable Lead Implant Date: 20200218
Implantable Lead Implant Date: 20200218
Implantable Lead Location: 753858
Implantable Lead Location: 753859
Implantable Lead Location: 753860
Implantable Pulse Generator Implant Date: 20200218
Lead Channel Impedance Value: 1100 Ohm
Lead Channel Impedance Value: 410 Ohm
Lead Channel Impedance Value: 630 Ohm
Lead Channel Pacing Threshold Amplitude: 0.5 V
Lead Channel Pacing Threshold Amplitude: 1.625 V
Lead Channel Pacing Threshold Amplitude: 2.625 V
Lead Channel Pacing Threshold Pulse Width: 0.4 ms
Lead Channel Pacing Threshold Pulse Width: 0.5 ms
Lead Channel Pacing Threshold Pulse Width: 0.5 ms
Lead Channel Sensing Intrinsic Amplitude: 1.6 mV
Lead Channel Sensing Intrinsic Amplitude: 11.4 mV
Lead Channel Setting Pacing Amplitude: 2.5 V
Lead Channel Setting Pacing Amplitude: 3.125
Lead Channel Setting Pacing Amplitude: 3.125
Lead Channel Setting Pacing Pulse Width: 0.4 ms
Lead Channel Setting Pacing Pulse Width: 0.5 ms
Lead Channel Setting Sensing Sensitivity: 0.5 mV
Pulse Gen Serial Number: 9880584

## 2020-03-13 NOTE — Progress Notes (Signed)
Remote ICD transmission.   

## 2020-03-20 DIAGNOSIS — I10 Essential (primary) hypertension: Secondary | ICD-10-CM | POA: Diagnosis not present

## 2020-03-20 DIAGNOSIS — I429 Cardiomyopathy, unspecified: Secondary | ICD-10-CM | POA: Diagnosis not present

## 2020-03-20 DIAGNOSIS — Z299 Encounter for prophylactic measures, unspecified: Secondary | ICD-10-CM | POA: Diagnosis not present

## 2020-03-20 DIAGNOSIS — M25561 Pain in right knee: Secondary | ICD-10-CM | POA: Diagnosis not present

## 2020-03-20 DIAGNOSIS — I5020 Unspecified systolic (congestive) heart failure: Secondary | ICD-10-CM | POA: Diagnosis not present

## 2020-03-24 ENCOUNTER — Ambulatory Visit (INDEPENDENT_AMBULATORY_CARE_PROVIDER_SITE_OTHER): Payer: Medicare Other

## 2020-03-24 DIAGNOSIS — Z95810 Presence of automatic (implantable) cardiac defibrillator: Secondary | ICD-10-CM

## 2020-03-24 DIAGNOSIS — I5022 Chronic systolic (congestive) heart failure: Secondary | ICD-10-CM

## 2020-03-24 NOTE — Progress Notes (Signed)
EPIC Encounter for ICM Monitoring  Patient Name: Kathleen Edwards is a 74 y.o. female Date: 03/24/2020 Primary Care Physican: Glenda Chroman, MD Primary Cardiologist:Branch Electrophysiologist:Allred Bi-V Pacing:99% LastWeight: 147lbs  AT/AF Burden: <1% (taking Eliquis)   Spoke with patient and reports she has some occasional ankle swelling.    CorVue thoracic impedancesuggesting possible fluid accumulation starting 03/16/20.  Prescribed:  Furosemide20 mg take 1 tablet daily. Potassium 10 mEq take 1 tablet daily  Labs: 08/16/2019 Creatinine0.75, BUN19, Potassium3.9, Sodium142, [id number] A complete set of results can be found in Results Review.  Recommendations: Advised to take Lasix 2 tablets daily x 2 days and then return to taking 1 tablet a day.   Follow-up plan: ICM clinic phone appointment on10/10/2019 (fluid recheck in office on 03/28/20). 91 day device clinic remote transmission11/16/2021.  EP/Cardiology Office Visits:03/28/2020 with Dr Rayann Heman.   Copy of ICM check sent to Dr.Allred.   3 month ICM trend: 03/24/2020    1 Year ICM trend:       Rosalene Billings, RN 03/24/2020 10:57 AM

## 2020-03-28 ENCOUNTER — Encounter: Payer: Medicare Other | Admitting: Internal Medicine

## 2020-04-24 DIAGNOSIS — E7849 Other hyperlipidemia: Secondary | ICD-10-CM | POA: Diagnosis not present

## 2020-04-24 DIAGNOSIS — E559 Vitamin D deficiency, unspecified: Secondary | ICD-10-CM | POA: Diagnosis not present

## 2020-04-24 DIAGNOSIS — I5020 Unspecified systolic (congestive) heart failure: Secondary | ICD-10-CM | POA: Diagnosis not present

## 2020-04-24 DIAGNOSIS — I110 Hypertensive heart disease with heart failure: Secondary | ICD-10-CM | POA: Diagnosis not present

## 2020-04-28 ENCOUNTER — Ambulatory Visit: Payer: Medicare Other

## 2020-04-28 DIAGNOSIS — I5022 Chronic systolic (congestive) heart failure: Secondary | ICD-10-CM

## 2020-04-28 DIAGNOSIS — Z95810 Presence of automatic (implantable) cardiac defibrillator: Secondary | ICD-10-CM

## 2020-04-29 ENCOUNTER — Telehealth: Payer: Self-pay

## 2020-04-29 NOTE — Progress Notes (Signed)
EPIC Encounter for ICM Monitoring  Patient Name: Kathleen Edwards is a 74 y.o. female Date: 04/29/2020 Primary Care Physican: Glenda Chroman, MD Primary Cardiologist:Branch Electrophysiologist:Allred Bi-V Pacing:99% LastWeight: 147lbs  AT/AF Burden: <1% (taking Eliquis)   Attempted call to patient and unable to reach.  Left detailed message per DPR regarding transmission. Transmission reviewed.   CorVue thoracic impedancesuggesting normal fluid levels.  Prescribed:  Furosemide20 mg take 1 tablet daily. Potassium 10 mEq take 1 tablet daily  Labs: 08/16/2019 Creatinine0.75, BUN19, Potassium3.9, Sodium142, [id number] A complete set of results can be found in Results Review.  Recommendations: Left voice mail with ICM number and encouraged to call if experiencing any fluid symptoms.   Follow-up plan: ICM clinic phone appointment on11/02/2020. 91 day device clinic remote transmission11/16/2021.  EP/Cardiology Office Visits:05/30/2020 with Dr Rayann Heman. 09/16/2020 with Dr Harl Bowie  Copy of ICM check sent to Dr.Allred   3 month ICM trend: 04/28/2020    1 Year ICM trend:       Rosalene Billings, RN 04/29/2020 2:43 PM

## 2020-04-29 NOTE — Telephone Encounter (Signed)
Remote ICM transmission received.  Attempted call to patient regarding ICM remote transmission and left detailed message per DPR.  Advised to return call for any fluid symptoms or questions. Next ICM remote transmission scheduled 06/02/2020.     

## 2020-05-23 ENCOUNTER — Other Ambulatory Visit: Payer: Self-pay | Admitting: Cardiology

## 2020-05-23 DIAGNOSIS — E7849 Other hyperlipidemia: Secondary | ICD-10-CM | POA: Diagnosis not present

## 2020-05-23 DIAGNOSIS — I110 Hypertensive heart disease with heart failure: Secondary | ICD-10-CM | POA: Diagnosis not present

## 2020-05-23 DIAGNOSIS — E559 Vitamin D deficiency, unspecified: Secondary | ICD-10-CM | POA: Diagnosis not present

## 2020-05-23 DIAGNOSIS — I5020 Unspecified systolic (congestive) heart failure: Secondary | ICD-10-CM | POA: Diagnosis not present

## 2020-05-30 ENCOUNTER — Ambulatory Visit (INDEPENDENT_AMBULATORY_CARE_PROVIDER_SITE_OTHER): Payer: Medicare Other | Admitting: Internal Medicine

## 2020-05-30 ENCOUNTER — Encounter: Payer: Self-pay | Admitting: Internal Medicine

## 2020-05-30 DIAGNOSIS — M25561 Pain in right knee: Secondary | ICD-10-CM | POA: Diagnosis not present

## 2020-05-30 DIAGNOSIS — I428 Other cardiomyopathies: Secondary | ICD-10-CM

## 2020-05-30 DIAGNOSIS — Z299 Encounter for prophylactic measures, unspecified: Secondary | ICD-10-CM | POA: Diagnosis not present

## 2020-05-30 DIAGNOSIS — I5022 Chronic systolic (congestive) heart failure: Secondary | ICD-10-CM

## 2020-05-30 DIAGNOSIS — I10 Essential (primary) hypertension: Secondary | ICD-10-CM | POA: Diagnosis not present

## 2020-05-30 LAB — CUP PACEART INCLINIC DEVICE CHECK
Date Time Interrogation Session: 20211105100820
Implantable Lead Implant Date: 20200218
Implantable Lead Implant Date: 20200218
Implantable Lead Implant Date: 20200218
Implantable Lead Location: 753858
Implantable Lead Location: 753859
Implantable Lead Location: 753860
Implantable Pulse Generator Implant Date: 20200218
Pulse Gen Serial Number: 9880584

## 2020-05-30 NOTE — Progress Notes (Signed)
PCP: Glenda Chroman, MD Primary Cardiologist: Dr Harl Bowie Primary EP: Dr Rayann Heman  Kathleen Edwards is a 74 y.o. female who presents today for routine electrophysiology followup.  Since last being seen in our clinic, the patient reports doing very well.  Today, she denies symptoms of palpitations, chest pain, shortness of breath,  lower extremity edema, dizziness, presyncope, syncope, or ICD shocks.  The patient is otherwise without complaint today.   Past Medical History:  Diagnosis Date  . Acquired absence of both cervix and uterus   . Anxiety disorder   . Asthma   . Degenerative joint disease of hand   . Diverticulosis   . Fatigue   . H/O: osteoarthritis   . History of vitamin A deficiency   . Hyperlipidemia   . Hypertension   . Keratosis, seborrheic   . Lipoma   . Ovarian failure   . Rhinitis, allergic   . Vitamin D deficiency    Past Surgical History:  Procedure Laterality Date  . BIV ICD INSERTION CRT-D N/A 09/12/2018   Procedure: BIV ICD INSERTION CRT-D;  Surgeon: Thompson Grayer, MD;  Location: [place] [facility];  Service: Cardiovascular;  Laterality: N/A;  . RIGHT/LEFT HEART CATH AND CORONARY ANGIOGRAPHY N/A 02/15/2018   Procedure: RIGHT/LEFT HEART CATH AND CORONARY ANGIOGRAPHY;  Surgeon: Troy Sine, MD;  Location: [place] [facility];  Service: Cardiovascular;  Laterality: N/A;  . TOTAL ABDOMINAL HYSTERECTOMY  1979    ROS- all systems are reviewed and negative except as per HPI above  Current Outpatient Medications  Medication Sig Dispense Refill  . Cholecalciferol (VITAMIN D PO) Take 1 capsule by mouth daily.    Marland Kitchen ELIQUIS 5 MG TABS tablet TAKE 1 TABLET BY MOUTH TWICE DAILY 60 tablet 6  . ENTRESTO 97-103 MG TAKE 1 TABLET BY MOUTH TWICE DAILY 60 tablet 6  . furosemide (LASIX) 20 MG tablet TAKE 1 TABLET BY MOUTH DAILY 90 tablet 1  . metoprolol succinate (TOPROL-XL) 25 MG 24 hr tablet TAKE 1 AND 1/2 TABLETS BY MOUTH DAILY 135 tablet 3  . potassium chloride (K-DUR) 10  MEQ tablet Take 10 mEq by mouth daily.    . pravastatin (PRAVACHOL) 40 MG tablet Take 40 mg by mouth daily.     No current facility-administered medications for this visit.    Physical Exam: Vitals:   05/30/20 1042  BP: (!) 150/72  Pulse: 60  SpO2: 98%  Weight: 144 lb (65.3 kg)  Height: 5\' 4"  (1.626 m)    GEN- The patient is well appearing, alert and oriented x 3 today.   Head- normocephalic, atraumatic Eyes-  Sclera clear, conjunctiva pink Ears- hearing intact Oropharynx- clear Lungs- Clear to ausculation bilaterally, normal work of breathing Chest- ICD pocket is well healed Heart- Regular rate and rhythm, no murmurs, rubs or gallops, PMI not laterally displaced GI- soft, NT, ND, + BS Extremities- no clubbing, cyanosis, or edema  ICD interrogation- reviewed in detail today,  See PACEART report  ekg tracing ordered today is personally reviewed and shows sinus with BiV pacing  Wt Readings from Last 3 Encounters:  05/30/20 144 lb (65.3 kg)  03/03/20 146 lb (66.2 kg)  08/31/19 147 lb (66.7 kg)    Assessment and Plan:  1.  Chronic systolic dysfunction/ nonischemic CM euvolemic today EF recovered with CRT (EF 55% by echo 04/05/19) Stable on an appropriate medical regimen Normal ICD function See Claudia Desanctis Art report No changes today she is not device dependant today followed in ICM  device clinic  2. Paroxysmal atrial fibrillation afib burden is <1 % chads2vasc score is 4.  She is on eliquis  Risks, benefits and potential toxicities for medications prescribed and/or refilled reviewed with patient today.   Thompson Grayer MD, [facility] 05/30/2020 10:58 AM

## 2020-05-30 NOTE — Patient Instructions (Addendum)
Medication Instructions:   Your physician recommends that you continue on your current medications as directed. Please refer to the Current Medication list given to you today.  Labwork:  None  Testing/Procedures:  None  Follow-Up:  Your physician recommends that you schedule a follow-up appointment in: 1 year.  Any Other Special Instructions Will Be Listed Below (If Applicable).  If you need a refill on your cardiac medications before your next appointment, please call your pharmacy. 

## 2020-05-31 DIAGNOSIS — M1711 Unilateral primary osteoarthritis, right knee: Secondary | ICD-10-CM | POA: Diagnosis not present

## 2020-05-31 DIAGNOSIS — M25561 Pain in right knee: Secondary | ICD-10-CM | POA: Diagnosis not present

## 2020-06-02 ENCOUNTER — Ambulatory Visit (INDEPENDENT_AMBULATORY_CARE_PROVIDER_SITE_OTHER): Payer: Medicare Other

## 2020-06-02 DIAGNOSIS — Z95810 Presence of automatic (implantable) cardiac defibrillator: Secondary | ICD-10-CM | POA: Diagnosis not present

## 2020-06-02 DIAGNOSIS — I5022 Chronic systolic (congestive) heart failure: Secondary | ICD-10-CM | POA: Diagnosis not present

## 2020-06-03 ENCOUNTER — Telehealth: Payer: Self-pay

## 2020-06-03 NOTE — Telephone Encounter (Signed)
Remote ICM transmission received.  Attempted call to patient regarding ICM remote transmission and left detailed message per DPR.  Advised to return call for any fluid symptoms or questions. Next ICM remote transmission scheduled 06/10/2020.

## 2020-06-03 NOTE — Progress Notes (Signed)
EPIC Encounter for ICM Monitoring  Patient Name: Kathleen Edwards is a 74 y.o. female Date: 06/03/2020 Primary Care Physican: Glenda Chroman, MD Primary Cardiologist:Branch Electrophysiologist:Allred Bi-V Pacing:99% LastWeight: 147lbs  AT/AF Burden: <1% (taking Eliquis)   Attempted call to patient and unable to reach.  Left detailed message per DPR regarding transmission. Transmission reviewed.   CorVue thoracic impedancesuggesting possible fluid accumulation starting 06/01/2020.  Prescribed:  Furosemide20 mg take 1 tablet daily. Potassium 10 mEq take 1 tablet daily  Labs: 08/15/2019 Creatinine0.75, BUN19, Potassium3.9, Sodium142, [id number] A complete set of results can be found in Results Review.  Recommendations: Left voice mail with ICM number and encouraged to call if experiencing any fluid symptoms.  If patient reached will advise to take Furosemide 2 tablets x 2 days  Follow-up plan: ICM clinic phone appointment on11/16/2021 to recheck fluid levels. 91 day device clinic remote transmission11/16/2021.  EP/Cardiology Office Visits: 09/16/2020 with Dr Harl Bowie  Copy of ICM check sent to [facility] and Dr Harl Bowie,  3 month ICM trend: 06/02/2020    1 Year ICM trend:       Rosalene Billings, RN 06/03/2020 3:52 PM

## 2020-06-10 ENCOUNTER — Telehealth: Payer: Self-pay | Admitting: Emergency Medicine

## 2020-06-10 ENCOUNTER — Ambulatory Visit (INDEPENDENT_AMBULATORY_CARE_PROVIDER_SITE_OTHER): Payer: Medicare Other

## 2020-06-10 DIAGNOSIS — I428 Other cardiomyopathies: Secondary | ICD-10-CM | POA: Diagnosis not present

## 2020-06-10 LAB — CUP PACEART REMOTE DEVICE CHECK
Battery Remaining Longevity: 62 mo
Battery Remaining Percentage: 77 %
Battery Voltage: 2.98 V
Brady Statistic AP VP Percent: 21 %
Brady Statistic AP VS Percent: 1 %
Brady Statistic AS VP Percent: 78 %
Brady Statistic AS VS Percent: 1 %
Brady Statistic RA Percent Paced: 20 %
Date Time Interrogation Session: 20211116020014
HighPow Impedance: 63 Ohm
HighPow Impedance: 63 Ohm
Implantable Lead Implant Date: 20200218
Implantable Lead Implant Date: 20200218
Implantable Lead Implant Date: 20200218
Implantable Lead Location: 753858
Implantable Lead Location: 753859
Implantable Lead Location: 753860
Implantable Pulse Generator Implant Date: 20200218
Lead Channel Impedance Value: 1125 Ohm
Lead Channel Impedance Value: 400 Ohm
Lead Channel Impedance Value: 630 Ohm
Lead Channel Pacing Threshold Amplitude: 0.5 V
Lead Channel Pacing Threshold Amplitude: 0.875 V
Lead Channel Pacing Threshold Amplitude: 2 V
Lead Channel Pacing Threshold Pulse Width: 0.4 ms
Lead Channel Pacing Threshold Pulse Width: 0.5 ms
Lead Channel Pacing Threshold Pulse Width: 0.7 ms
Lead Channel Sensing Intrinsic Amplitude: 1.7 mV
Lead Channel Sensing Intrinsic Amplitude: 10.3 mV
Lead Channel Setting Pacing Amplitude: 1.875
Lead Channel Setting Pacing Amplitude: 2 V
Lead Channel Setting Pacing Amplitude: 2.5 V
Lead Channel Setting Pacing Pulse Width: 0.4 ms
Lead Channel Setting Pacing Pulse Width: 0.7 ms
Lead Channel Setting Sensing Sensitivity: 0.5 mV
Pulse Gen Serial Number: 9880584

## 2020-06-10 NOTE — Telephone Encounter (Signed)
LMOM to call Device Clinic . Device Clinic # and office hours provided. Assess for s/sx due to AT/AF burden increase from 1 % on 06/02/20 to 26% on 06/10/20. # NSVT episodes that appear to be AF with RVR and 1 AT/AF episodes 06/09/20 at 1705 that lasted 7 hrs , 23 minutes. + Eliquis, Toprol XL 37/5 mg QD.

## 2020-06-11 ENCOUNTER — Ambulatory Visit (INDEPENDENT_AMBULATORY_CARE_PROVIDER_SITE_OTHER): Payer: Medicare Other

## 2020-06-11 DIAGNOSIS — Z95810 Presence of automatic (implantable) cardiac defibrillator: Secondary | ICD-10-CM

## 2020-06-11 DIAGNOSIS — I5022 Chronic systolic (congestive) heart failure: Secondary | ICD-10-CM

## 2020-06-12 NOTE — Progress Notes (Signed)
Remote ICD transmission.   

## 2020-06-13 DIAGNOSIS — R262 Difficulty in walking, not elsewhere classified: Secondary | ICD-10-CM | POA: Diagnosis not present

## 2020-06-13 DIAGNOSIS — I10 Essential (primary) hypertension: Secondary | ICD-10-CM | POA: Diagnosis not present

## 2020-06-13 DIAGNOSIS — M25561 Pain in right knee: Secondary | ICD-10-CM | POA: Diagnosis not present

## 2020-06-13 DIAGNOSIS — Z299 Encounter for prophylactic measures, unspecified: Secondary | ICD-10-CM | POA: Diagnosis not present

## 2020-06-13 DIAGNOSIS — M2560 Stiffness of unspecified joint, not elsewhere classified: Secondary | ICD-10-CM | POA: Diagnosis not present

## 2020-06-13 DIAGNOSIS — M25562 Pain in left knee: Secondary | ICD-10-CM | POA: Diagnosis not present

## 2020-06-13 NOTE — Progress Notes (Signed)
EPIC Encounter for ICM Monitoring  Patient Name: Kathleen Edwards is a 74 y.o. female Date: 06/13/2020 Primary Care Physican: Glenda Chroman, MD Primary Cardiologist:Branch Electrophysiologist:Allred Bi-V Pacing:99% LastWeight: 147lbs  AT/AF Burden: 2.3% (taking Eliquis)   Transmission reviewed.Device clinic nurse attempted to contact patient regarding increased AF Burden.  CorVue thoracic impedancesuggestingfluid levels returned to normal.  Prescribed:  Furosemide20 mg take 1 tablet daily. Potassium 10 mEq take 1 tablet daily  Labs: 08/15/2019 Creatinine0.75, BUN19, Potassium3.9, Sodium142, [id number] A complete set of results can be found in Results Review.  Recommendations:No changes  Follow-up plan: ICM clinic phone appointment on12/14/2021. 91 day device clinic remote transmission2/15/2022.  EP/Cardiology Office Visits:09/16/2020 with Dr Harl Bowie  Copy of ICM check sent to Dr.Allred   3 month ICM trend: 06/10/2020    1 Year ICM trend:       Rosalene Billings, RN 06/13/2020 2:16 PM

## 2020-06-24 DIAGNOSIS — I5020 Unspecified systolic (congestive) heart failure: Secondary | ICD-10-CM | POA: Diagnosis not present

## 2020-06-24 DIAGNOSIS — I10 Essential (primary) hypertension: Secondary | ICD-10-CM | POA: Diagnosis not present

## 2020-06-24 DIAGNOSIS — Z299 Encounter for prophylactic measures, unspecified: Secondary | ICD-10-CM | POA: Diagnosis not present

## 2020-06-24 DIAGNOSIS — I429 Cardiomyopathy, unspecified: Secondary | ICD-10-CM | POA: Diagnosis not present

## 2020-07-08 ENCOUNTER — Ambulatory Visit (INDEPENDENT_AMBULATORY_CARE_PROVIDER_SITE_OTHER): Payer: Medicare Other

## 2020-07-08 DIAGNOSIS — I5022 Chronic systolic (congestive) heart failure: Secondary | ICD-10-CM

## 2020-07-08 DIAGNOSIS — Z95810 Presence of automatic (implantable) cardiac defibrillator: Secondary | ICD-10-CM | POA: Diagnosis not present

## 2020-07-11 ENCOUNTER — Telehealth: Payer: Self-pay

## 2020-07-11 NOTE — Telephone Encounter (Signed)
Remote ICM transmission received.  Attempted call to patient regarding ICM remote transmission and left message to return call   

## 2020-07-11 NOTE — Progress Notes (Signed)
EPIC Encounter for ICM Monitoring  Patient Name: Kathleen Edwards is a 73 y.o. female Date: 07/11/2020 Primary Care Physican: Glenda Chroman, MD Primary Cardiologist:Branch Electrophysiologist:Allred Bi-V Pacing:99% LastWeight: 147lbs  AT/AF Burden: 2.3% (taking Eliquis)   Attempted call to patient and unable to reach.  Left message to return call. Transmission reviewed.   CorVue thoracic impedancesuggestingfluid levels returned to normal.  Prescribed:  Furosemide20 mg take 1 tablet daily. Potassium 10 mEq take 1 tablet daily  Labs: 08/15/2019 Creatinine0.75, BUN19, Potassium3.9, Sodium142, [id number] A complete set of results can be found in Results Review.  Recommendations:Unable to reach.    Follow-up plan: ICM clinic phone appointment on2/08/2020. 91 day device clinic remote transmission2/15/2022.  EP/Cardiology Office Visits:09/16/2020 with Dr Harl Bowie  Copy of ICM check sent to [place] through 09/11/2019   3 month ICM trend: 07/08/2020    1 Year ICM trend:       Rosalene Billings, RN 07/11/2020 4:34 PM

## 2020-07-24 ENCOUNTER — Other Ambulatory Visit: Payer: Self-pay | Admitting: Cardiology

## 2020-07-25 DIAGNOSIS — E559 Vitamin D deficiency, unspecified: Secondary | ICD-10-CM | POA: Diagnosis not present

## 2020-07-25 DIAGNOSIS — I110 Hypertensive heart disease with heart failure: Secondary | ICD-10-CM | POA: Diagnosis not present

## 2020-07-25 DIAGNOSIS — I5020 Unspecified systolic (congestive) heart failure: Secondary | ICD-10-CM | POA: Diagnosis not present

## 2020-07-25 DIAGNOSIS — E7849 Other hyperlipidemia: Secondary | ICD-10-CM | POA: Diagnosis not present

## 2020-08-08 DIAGNOSIS — M25562 Pain in left knee: Secondary | ICD-10-CM | POA: Diagnosis not present

## 2020-08-08 DIAGNOSIS — Z8739 Personal history of other diseases of the musculoskeletal system and connective tissue: Secondary | ICD-10-CM | POA: Diagnosis not present

## 2020-08-08 DIAGNOSIS — M170 Bilateral primary osteoarthritis of knee: Secondary | ICD-10-CM | POA: Diagnosis not present

## 2020-08-08 DIAGNOSIS — Z299 Encounter for prophylactic measures, unspecified: Secondary | ICD-10-CM | POA: Diagnosis not present

## 2020-08-08 DIAGNOSIS — M1712 Unilateral primary osteoarthritis, left knee: Secondary | ICD-10-CM | POA: Diagnosis not present

## 2020-08-08 DIAGNOSIS — M25561 Pain in right knee: Secondary | ICD-10-CM | POA: Diagnosis not present

## 2020-08-25 DIAGNOSIS — I110 Hypertensive heart disease with heart failure: Secondary | ICD-10-CM | POA: Diagnosis not present

## 2020-08-25 DIAGNOSIS — E559 Vitamin D deficiency, unspecified: Secondary | ICD-10-CM | POA: Diagnosis not present

## 2020-08-25 DIAGNOSIS — E7849 Other hyperlipidemia: Secondary | ICD-10-CM | POA: Diagnosis not present

## 2020-08-25 DIAGNOSIS — I5020 Unspecified systolic (congestive) heart failure: Secondary | ICD-10-CM | POA: Diagnosis not present

## 2020-08-27 ENCOUNTER — Telehealth: Payer: Self-pay

## 2020-08-27 ENCOUNTER — Ambulatory Visit (INDEPENDENT_AMBULATORY_CARE_PROVIDER_SITE_OTHER): Payer: Medicare Other

## 2020-08-27 DIAGNOSIS — Z95810 Presence of automatic (implantable) cardiac defibrillator: Secondary | ICD-10-CM | POA: Diagnosis not present

## 2020-08-27 DIAGNOSIS — I5022 Chronic systolic (congestive) heart failure: Secondary | ICD-10-CM

## 2020-08-27 NOTE — Progress Notes (Signed)
EPIC Encounter for ICM Monitoring  Patient Name: VIVIEN BARRETTO is a 75 y.o. female Date: 08/27/2020 Primary Care Physican: Glenda Chroman, MD Primary Cardiologist:Branch Electrophysiologist:Allred Bi-V Pacing:99% LastWeight: 147lbs  AT/AF Burden:<1% (taking Eliquis)   Attempted call to patient and unable to reach.  Left message to return call. Transmission reviewed.   CorVue thoracic impedancesuggestingpossible fluid accumulation starting 08/19/2020 but trending back close to baseline.  Prescribed:  Furosemide20 mg take 1 tablet daily. Potassium 10 mEq take 1 tablet daily  Labs: 08/15/2019 Creatinine0.75, BUN19, Potassium3.9, Sodium142, [id number] A complete set of results can be found in Results Review.  Recommendations:Unable to reach.    Follow-up plan: ICM clinic phone appointment on3/01/2021. 91 day device clinic remote transmission2/15/2022.  EP/Cardiology Office Visits:09/16/2020 with Dr Harl Bowie  Copy of ICM check sent to Dr.Allredand Dr Harl Bowie for [facility].   3 month ICM trend: 08/27/2020.    1 Year ICM trend:       Rosalene Billings, RN 08/27/2020 1:15 PM

## 2020-08-27 NOTE — Telephone Encounter (Signed)
Remote ICM transmission received.  Attempted call to patient regarding ICM remote transmission and no answer or voice mail option.  

## 2020-08-29 NOTE — Progress Notes (Signed)
Spoke with patient and reports feeling well at this time.  Denies fluid symptoms.  She did not have any symptoms during decreased impedance.  She is taking meds as prescribed.  Weight stable at 147 lbs.  No changes and encouraged to call if experiencing any fluid symptoms.

## 2020-09-01 DIAGNOSIS — M1711 Unilateral primary osteoarthritis, right knee: Secondary | ICD-10-CM | POA: Diagnosis not present

## 2020-09-01 DIAGNOSIS — Z713 Dietary counseling and surveillance: Secondary | ICD-10-CM | POA: Diagnosis not present

## 2020-09-01 DIAGNOSIS — Z299 Encounter for prophylactic measures, unspecified: Secondary | ICD-10-CM | POA: Diagnosis not present

## 2020-09-09 ENCOUNTER — Ambulatory Visit (INDEPENDENT_AMBULATORY_CARE_PROVIDER_SITE_OTHER): Payer: Medicare Other

## 2020-09-09 DIAGNOSIS — I428 Other cardiomyopathies: Secondary | ICD-10-CM | POA: Diagnosis not present

## 2020-09-09 LAB — CUP PACEART REMOTE DEVICE CHECK
Battery Remaining Longevity: 60 mo
Battery Remaining Percentage: 74 %
Battery Voltage: 2.98 V
Brady Statistic AP VP Percent: 17 %
Brady Statistic AP VS Percent: 1 %
Brady Statistic AS VP Percent: 82 %
Brady Statistic AS VS Percent: 1 %
Brady Statistic RA Percent Paced: 17 %
Date Time Interrogation Session: 20220215020016
HighPow Impedance: 66 Ohm
HighPow Impedance: 66 Ohm
Implantable Lead Implant Date: 20200218
Implantable Lead Implant Date: 20200218
Implantable Lead Implant Date: 20200218
Implantable Lead Location: 753858
Implantable Lead Location: 753859
Implantable Lead Location: 753860
Implantable Pulse Generator Implant Date: 20200218
Lead Channel Impedance Value: 1100 Ohm
Lead Channel Impedance Value: 400 Ohm
Lead Channel Impedance Value: 550 Ohm
Lead Channel Pacing Threshold Amplitude: 0.625 V
Lead Channel Pacing Threshold Amplitude: 1.125 V
Lead Channel Pacing Threshold Amplitude: 2.375 V
Lead Channel Pacing Threshold Pulse Width: 0.4 ms
Lead Channel Pacing Threshold Pulse Width: 0.5 ms
Lead Channel Pacing Threshold Pulse Width: 0.7 ms
Lead Channel Sensing Intrinsic Amplitude: 1.2 mV
Lead Channel Sensing Intrinsic Amplitude: 10.7 mV
Lead Channel Setting Pacing Amplitude: 2 V
Lead Channel Setting Pacing Amplitude: 2.125
Lead Channel Setting Pacing Amplitude: 2.875
Lead Channel Setting Pacing Pulse Width: 0.4 ms
Lead Channel Setting Pacing Pulse Width: 0.7 ms
Lead Channel Setting Sensing Sensitivity: 0.5 mV
Pulse Gen Serial Number: 9880584

## 2020-09-16 ENCOUNTER — Encounter: Payer: Self-pay | Admitting: *Deleted

## 2020-09-16 ENCOUNTER — Encounter: Payer: Self-pay | Admitting: Cardiology

## 2020-09-16 ENCOUNTER — Ambulatory Visit: Payer: Medicare Other | Admitting: Cardiology

## 2020-09-16 VITALS — BP 112/60 | HR 60 | Ht 63.5 in | Wt 150.0 lb

## 2020-09-16 DIAGNOSIS — I5022 Chronic systolic (congestive) heart failure: Secondary | ICD-10-CM

## 2020-09-16 DIAGNOSIS — I480 Paroxysmal atrial fibrillation: Secondary | ICD-10-CM | POA: Diagnosis not present

## 2020-09-16 DIAGNOSIS — I2510 Atherosclerotic heart disease of native coronary artery without angina pectoris: Secondary | ICD-10-CM

## 2020-09-16 NOTE — Progress Notes (Signed)
Clinical Summary Kathleen Edwards is a 75 y.o.female  seen today for follow up fo the following medical problems.  1.Chronic systolic HFnow with normalized LVEF - admitted 01/17/18 withnew diagnosis of systolic HF. - from notes presented with hypertensive emergency, started on nitro gtt and diuresed - EKG LBBB,  - 12/2017 echo [facility]: LVEF 20% - 12/2017 cath with mild to moderate CAD. CI 3.2, PCWP 10, LVEDP 14. Overall normal CO and filling pressures Jan 2020 echo: LVEF 20-25%.  08/2018 BiV AICD placed, followed by EP  03/2019 echo: LVEF 55-60%,     - no SOB/DOE. No recent edema - complant with meds - 05/2020 normal device check   2.PAF -  noted device checks 11/2018 and 02/2019 - CHADS2Vasc score is 5 (CHF, HTN, age x1, CAD,gender)  -no recent palpitations - No bleeding on eliquis.   3. CAD - 12/2017 cath as reported below, overall mild to moderate disease - no recent chest pain.     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 Has had covid vaccine. Previously retired at [employer].     Past Medical History:  Diagnosis Date  . Acquired absence of both cervix and uterus   . Anxiety disorder   . Asthma   . Degenerative joint disease of hand   . Diverticulosis   . Fatigue   . H/O: osteoarthritis   . History of vitamin A deficiency   . Hyperlipidemia   . Hypertension   . Keratosis, seborrheic   . Lipoma   . Ovarian failure   . Rhinitis, allergic   . Vitamin D deficiency      Allergies  Allergen Reactions  . Lisinopril Cough  . Vioxx [Rofecoxib] Other (See Comments)    Epigastric discomfort      Current Outpatient Medications  Medication Sig Dispense Refill  . Cholecalciferol (VITAMIN D PO) Take 1 capsule by mouth daily.    Marland Kitchen ELIQUIS 5 MG TABS tablet TAKE 1 TABLET BY MOUTH TWICE DAILY 60 tablet 6  . ENTRESTO 97-103 MG TAKE 1 TABLET BY MOUTH TWICE DAILY 60 tablet 6  . furosemide (LASIX) 20 MG  tablet TAKE 1 TABLET BY MOUTH DAILY 90 tablet 3  . metoprolol succinate (TOPROL-XL) 25 MG 24 hr tablet TAKE 1 AND 1/2 TABLETS BY MOUTH DAILY 135 tablet 3  . potassium chloride (K-DUR) 10 MEQ tablet Take 10 mEq by mouth daily.    . pravastatin (PRAVACHOL) 40 MG tablet Take 40 mg by mouth daily.     No current facility-administered medications for this visit.     Past Surgical History:  Procedure Laterality Date  . BIV ICD INSERTION CRT-D N/A 09/12/2018   Procedure: BIV ICD INSERTION CRT-D;  Surgeon: Thompson Grayer, MD;  Location: [place] [facility];  Service: Cardiovascular;  Laterality: N/A;  . RIGHT/LEFT HEART CATH AND CORONARY ANGIOGRAPHY N/A 02/15/2018   Procedure: RIGHT/LEFT HEART CATH AND CORONARY ANGIOGRAPHY;  Surgeon: Troy Sine, MD;  Location: [place] [facility];  Service: Cardiovascular;  Laterality: N/A;  . TOTAL ABDOMINAL HYSTERECTOMY  1979     Allergies  Allergen Reactions  . Lisinopril Cough  . Vioxx [Rofecoxib] Other (See Comments)    Epigastric discomfort       Family History  Problem Relation Age of Onset  . CVA Mother   . Brain cancer Father      Social History Ms. Meanor reports that she quit smoking about 22 years ago. She has  never used smokeless tobacco. Ms. Vanderzanden reports previous alcohol use.   Review of Systems CONSTITUTIONAL: No weight loss, fever, chills, weakness or fatigue.  HEENT: Eyes: No visual loss, blurred vision, double vision or yellow sclerae.No hearing loss, sneezing, congestion, runny nose or sore throat.  SKIN: No rash or itching.  CARDIOVASCULAR: per hpi RESPIRATORY: No shortness of breath, cough or sputum.  GASTROINTESTINAL: No anorexia, nausea, vomiting or diarrhea. No abdominal pain or blood.  GENITOURINARY: No burning on urination, no polyuria NEUROLOGICAL: No headache, dizziness, syncope, paralysis, ataxia, numbness or tingling in the extremities. No change in bowel or bladder control.  MUSCULOSKELETAL: No muscle, back  pain, joint pain or stiffness.  LYMPHATICS: No enlarged nodes. No history of splenectomy.  PSYCHIATRIC: No history of depression or anxiety.  ENDOCRINOLOGIC: No reports of sweating, cold or heat intolerance. No polyuria or polydipsia.  Marland Kitchen   Physical Examination Today's Vitals   09/16/20 0816  BP: 112/60  Pulse: 60  SpO2: 97%  Weight: 150 lb (68 kg)  Height: 5' 3.5" (1.613 m)   Body mass index is 26.15 kg/m.  Gen: resting comfortably, no acute distress HEENT: no scleral icterus, pupils equal round and reactive, no palptable cervical adenopathy,  CV: RRR, no m/r/g, no jvd Resp: Clear to auscultation bilaterally GI: abdomen is soft, non-tender, non-distended, normal bowel sounds, no hepatosplenomegaly MSK: extremities are warm, no edema.  Skin: warm, no rash Neuro:  no focal deficits Psych: appropriate affect   Diagnostic Studies  12/2017 cath  Dist RCA lesion is 55% stenosed.  Mid LM to Dist LM lesion is 20% stenosed.  Ost Cx lesion is 20% stenosed.  Prox LAD lesion is 30% stenosed.  Prox RCA lesion is 20% stenosed.  Mid RCA lesion is 30% stenosed.  There is evidence for diffuse coronary calcification involving all coronary arteries.  Left main stenosis with distal 20% narrowing; proximal irregularity of the LAD with 30% proximal stenosis; 20% ostial stenosis of the left circumflex vessel; and very large dominant RCA with mild to moderate calcification and irregularity with 20% proximal 30% mid and 50 to 60% smooth stenosis immediately proximal to the takeoff of a large PDA and PLA vessel with 20% PLA narrowing.  RECOMMENDATION: Guideline directed medical therapy for the patient's cardiomyopathy which most likely is of nonischemic etiology.   Jan 2020 echo Study Conclusions  - Procedure narrative: Transthoracic echocardiography. Image quality was fair. The study was technically difficult, as a result of poor acoustic windows and body habitus. - Left  ventricle: The cavity size was normal. Wall thickness was normal. Systolic function was severely reduced. The estimated ejection fraction was in the range of 20% to 25%. Diffuse hypokinesis. Doppler parameters are consistent with abnormal left ventricular relaxation (grade 1 diastolic dysfunction). - Ventricular septum: Septal motion showed abnormal function and dyssynergy. These changes are consistent with a left bundle branch block. - Mitral valve: Mildly to moderately calcified annulus. There was mild regurgitation. - Left atrium: The atrium was mildly dilated. - Atrial septum: No defect or patent foramen ovale was identified.   Assessment and Plan  1Chronic systolic HF - LVEF has most recently normalized - no recent symptoms, continue current medical therapy.   2. PAF - no recent symptoms - continue current meds including anticoag  3. CAD - denies symptoms, continue to monitor.     Arnoldo Lenis, M.D.

## 2020-09-16 NOTE — Progress Notes (Signed)
Remote ICD transmission.   

## 2020-09-16 NOTE — Patient Instructions (Signed)
Your physician wants you to follow-up in: 6 MONTHS WITH DR BRANCH   Your physician recommends that you continue on your current medications as directed. Please refer to the Current Medication list given to you today.  Thank you for choosing [facility]!!   

## 2020-09-22 ENCOUNTER — Other Ambulatory Visit: Payer: Self-pay | Admitting: Cardiology

## 2020-09-25 DIAGNOSIS — Z299 Encounter for prophylactic measures, unspecified: Secondary | ICD-10-CM | POA: Diagnosis not present

## 2020-09-25 DIAGNOSIS — Z0000 Encounter for general adult medical examination without abnormal findings: Secondary | ICD-10-CM | POA: Diagnosis not present

## 2020-09-25 DIAGNOSIS — E7800 Pure hypercholesterolemia, unspecified: Secondary | ICD-10-CM | POA: Diagnosis not present

## 2020-09-25 DIAGNOSIS — I10 Essential (primary) hypertension: Secondary | ICD-10-CM | POA: Diagnosis not present

## 2020-09-25 DIAGNOSIS — Z7189 Other specified counseling: Secondary | ICD-10-CM | POA: Diagnosis not present

## 2020-09-25 DIAGNOSIS — Z79899 Other long term (current) drug therapy: Secondary | ICD-10-CM | POA: Diagnosis not present

## 2020-09-25 DIAGNOSIS — R5383 Other fatigue: Secondary | ICD-10-CM | POA: Diagnosis not present

## 2020-09-29 ENCOUNTER — Ambulatory Visit: Payer: Medicare Other

## 2020-09-29 DIAGNOSIS — M1711 Unilateral primary osteoarthritis, right knee: Secondary | ICD-10-CM | POA: Diagnosis not present

## 2020-09-29 DIAGNOSIS — I5022 Chronic systolic (congestive) heart failure: Secondary | ICD-10-CM

## 2020-09-29 DIAGNOSIS — Z95810 Presence of automatic (implantable) cardiac defibrillator: Secondary | ICD-10-CM

## 2020-10-01 ENCOUNTER — Telehealth: Payer: Self-pay

## 2020-10-01 NOTE — Progress Notes (Signed)
EPIC Encounter for ICM Monitoring  Patient Name: Kathleen Edwards is a 75 y.o. female Date: 10/01/2020 Primary Care Physican: Glenda Chroman, MD Primary Cardiologist:Branch Electrophysiologist:Allred Bi-V Pacing:99% 09/16/2020 Office Weight: 150lbs  AT/AF Burden:<1% (taking Eliquis)   Attempted call to patient and unable to reach. Left messageto return call. Transmission reviewed.  CorVue thoracic impedancesuggestingnormal fluid levels.  Prescribed:  Furosemide20 mg take 1 tablet daily. Potassium 10 mEq take 1 tablet daily  Labs: 08/15/2019 Creatinine0.75, BUN19, Potassium3.9, Sodium142, [id number] A complete set of results can be found in Results Review.  Recommendations:Unable to reach.  Follow-up plan: ICM clinic phone appointment on4/05/2021. 91 day device clinic remote transmission5/17/2022.  EP/Cardiology Office Visits:03/10/2021 with Dr Harl Bowie.  05/29/2021 with Dr Rayann Heman  Copy of ICM check sent to Dr.Allred  3 month ICM trend: 09/29/2020.    1 Year ICM trend:       Rosalene Billings, RN 10/01/2020 2:12 PM

## 2020-10-01 NOTE — Telephone Encounter (Signed)
Remote ICM transmission received.  Attempted call to patient regarding ICM remote transmission and left detailed message per DPR.  Advised to return call for any fluid symptoms or questions.  

## 2020-10-03 DIAGNOSIS — M1712 Unilateral primary osteoarthritis, left knee: Secondary | ICD-10-CM | POA: Diagnosis not present

## 2020-10-06 DIAGNOSIS — M1711 Unilateral primary osteoarthritis, right knee: Secondary | ICD-10-CM | POA: Diagnosis not present

## 2020-10-06 DIAGNOSIS — Z299 Encounter for prophylactic measures, unspecified: Secondary | ICD-10-CM | POA: Diagnosis not present

## 2020-10-09 DIAGNOSIS — M1712 Unilateral primary osteoarthritis, left knee: Secondary | ICD-10-CM | POA: Diagnosis not present

## 2020-10-13 DIAGNOSIS — M1711 Unilateral primary osteoarthritis, right knee: Secondary | ICD-10-CM | POA: Diagnosis not present

## 2020-10-17 DIAGNOSIS — M1712 Unilateral primary osteoarthritis, left knee: Secondary | ICD-10-CM | POA: Diagnosis not present

## 2020-10-20 DIAGNOSIS — M1711 Unilateral primary osteoarthritis, right knee: Secondary | ICD-10-CM | POA: Diagnosis not present

## 2020-10-22 DIAGNOSIS — E7849 Other hyperlipidemia: Secondary | ICD-10-CM | POA: Diagnosis not present

## 2020-10-22 DIAGNOSIS — I5020 Unspecified systolic (congestive) heart failure: Secondary | ICD-10-CM | POA: Diagnosis not present

## 2020-10-22 DIAGNOSIS — E781 Pure hyperglyceridemia: Secondary | ICD-10-CM | POA: Diagnosis not present

## 2020-10-22 DIAGNOSIS — E559 Vitamin D deficiency, unspecified: Secondary | ICD-10-CM | POA: Diagnosis not present

## 2020-10-23 DIAGNOSIS — M1712 Unilateral primary osteoarthritis, left knee: Secondary | ICD-10-CM | POA: Diagnosis not present

## 2020-11-03 ENCOUNTER — Ambulatory Visit (INDEPENDENT_AMBULATORY_CARE_PROVIDER_SITE_OTHER): Payer: Medicare Other

## 2020-11-03 DIAGNOSIS — Z95810 Presence of automatic (implantable) cardiac defibrillator: Secondary | ICD-10-CM

## 2020-11-03 DIAGNOSIS — I5022 Chronic systolic (congestive) heart failure: Secondary | ICD-10-CM | POA: Diagnosis not present

## 2020-11-04 NOTE — Progress Notes (Signed)
EPIC Encounter for ICM Monitoring  Patient Name: Kathleen Edwards is a 75 y.o. female Date: 11/04/2020 Primary Care Physican: Glenda Chroman, MD Primary Cardiologist:Branch Electrophysiologist:Allred Bi-V Pacing:99% 09/16/2020 Office Weight: 150lbs  AT/AF Burden:<1% (taking Eliquis)   Spoke with patient and reports feeling well at this time.  Denies fluid symptoms.    CorVue thoracic impedancesuggestingnormal fluid levels.  Prescribed:  Furosemide20 mg take 1 tablet daily. Potassium 10 mEq take 1 tablet daily  Labs: 08/15/2019 Creatinine0.75, BUN19, Potassium3.9, Sodium142, [id number] A complete set of results can be found in Results Review.  Recommendations: No changes and encouraged to call if experiencing any fluid symptoms.  Follow-up plan: ICM clinic phone appointment on 12/24/2020. 91 day device clinic remote transmission5/17/2022.  EP/Cardiology Office Visits:03/10/2021 with Dr Harl Bowie.  05/29/2021 with Dr Rayann Heman  Copy of ICM check sent to Dr.Allred  3 month ICM trend: 11/03/2020.    1 Year ICM trend:       Rosalene Billings, RN 11/04/2020 9:46 AM

## 2020-12-08 DIAGNOSIS — S2232XA Fracture of one rib, left side, initial encounter for closed fracture: Secondary | ICD-10-CM | POA: Diagnosis not present

## 2020-12-08 DIAGNOSIS — W010XXA Fall on same level from slipping, tripping and stumbling without subsequent striking against object, initial encounter: Secondary | ICD-10-CM | POA: Diagnosis not present

## 2020-12-09 ENCOUNTER — Ambulatory Visit (INDEPENDENT_AMBULATORY_CARE_PROVIDER_SITE_OTHER): Payer: Medicare Other

## 2020-12-09 DIAGNOSIS — I428 Other cardiomyopathies: Secondary | ICD-10-CM

## 2020-12-09 DIAGNOSIS — I5022 Chronic systolic (congestive) heart failure: Secondary | ICD-10-CM

## 2020-12-09 LAB — CUP PACEART REMOTE DEVICE CHECK
Battery Remaining Longevity: 58 mo
Battery Remaining Percentage: 71 %
Battery Voltage: 2.98 V
Brady Statistic AP VP Percent: 17 %
Brady Statistic AP VS Percent: 1 %
Brady Statistic AS VP Percent: 82 %
Brady Statistic AS VS Percent: 1 %
Brady Statistic RA Percent Paced: 16 %
Date Time Interrogation Session: 20220517020017
HighPow Impedance: 61 Ohm
HighPow Impedance: 61 Ohm
Implantable Lead Implant Date: 20200218
Implantable Lead Implant Date: 20200218
Implantable Lead Implant Date: 20200218
Implantable Lead Location: 753858
Implantable Lead Location: 753859
Implantable Lead Location: 753860
Implantable Pulse Generator Implant Date: 20200218
Lead Channel Impedance Value: 1125 Ohm
Lead Channel Impedance Value: 400 Ohm
Lead Channel Impedance Value: 510 Ohm
Lead Channel Pacing Threshold Amplitude: 0.625 V
Lead Channel Pacing Threshold Amplitude: 1.125 V
Lead Channel Pacing Threshold Amplitude: 2.75 V
Lead Channel Pacing Threshold Pulse Width: 0.4 ms
Lead Channel Pacing Threshold Pulse Width: 0.5 ms
Lead Channel Pacing Threshold Pulse Width: 0.7 ms
Lead Channel Sensing Intrinsic Amplitude: 1.5 mV
Lead Channel Sensing Intrinsic Amplitude: 11.4 mV
Lead Channel Setting Pacing Amplitude: 2 V
Lead Channel Setting Pacing Amplitude: 2.125
Lead Channel Setting Pacing Amplitude: 3.25 V
Lead Channel Setting Pacing Pulse Width: 0.4 ms
Lead Channel Setting Pacing Pulse Width: 0.7 ms
Lead Channel Setting Sensing Sensitivity: 0.5 mV
Pulse Gen Serial Number: 9880584

## 2020-12-20 ENCOUNTER — Other Ambulatory Visit: Payer: Self-pay | Admitting: Cardiology

## 2020-12-23 ENCOUNTER — Telehealth: Payer: Self-pay | Admitting: *Deleted

## 2020-12-23 DIAGNOSIS — I5022 Chronic systolic (congestive) heart failure: Secondary | ICD-10-CM

## 2020-12-23 NOTE — Telephone Encounter (Signed)
Pt will come by [facility] office to pick up lab orders

## 2020-12-23 NOTE — Telephone Encounter (Signed)
Prescription refill request for Eliquis received. Indication: PAF Last office visit: 09/16/20  Dr Harl Bowie  Scr: 0.75 on 1/21 Age: 75 Weight: 68kg  Based on above findings Eliquis 5mg  twice daily is the appropriate dose.  Refill approved x 1.  Pt is past due to CBC/BMP.  Message sent to Dr Nelly Laurence nurse to request labs.

## 2020-12-23 NOTE — Telephone Encounter (Signed)
-----   Message from Malen Gauze, RN sent at 12/23/2020  9:39 AM EDT ----- Hardie Pulley, Please arrange for pt to have CBC/BMP for Eliquis refills.  She is past due.  I have sent in 1 refill. Thanks, Lattie Haw

## 2020-12-24 ENCOUNTER — Ambulatory Visit (INDEPENDENT_AMBULATORY_CARE_PROVIDER_SITE_OTHER): Payer: Medicare Other

## 2020-12-24 DIAGNOSIS — I5022 Chronic systolic (congestive) heart failure: Secondary | ICD-10-CM

## 2020-12-24 DIAGNOSIS — Z95810 Presence of automatic (implantable) cardiac defibrillator: Secondary | ICD-10-CM | POA: Diagnosis not present

## 2020-12-26 ENCOUNTER — Telehealth: Payer: Self-pay

## 2020-12-26 NOTE — Telephone Encounter (Signed)
Remote ICM transmission received.  Attempted call to patient regarding ICM remote transmission and left detailed message per DPR.  Advised to return call for any fluid symptoms or questions. Next ICM remote transmission scheduled 01/27/2021.

## 2020-12-26 NOTE — Progress Notes (Signed)
EPIC Encounter for ICM Monitoring  Patient Name: Kathleen Edwards is a 75 y.o. female Date: 12/26/2020 Primary Care Physican: Glenda Chroman, MD Primary Cardiologist:Branch Electrophysiologist:Allred Bi-V Pacing:99% 09/16/2020 OfficeWeight: 150lbs  AT/AF Burden:<1% (taking Eliquis)   Attempted call to patient and unable to reach.  Left detailed message per DPR regarding transmission. Transmission reviewed.   CorVue thoracic impedancenormal fluid levels but was suggesting possible fluid accumulation from 5/12-5/21.  Prescribed:  Furosemide20 mg take 1 tablet daily. Potassium 10 mEq take 1 tablet daily  Labs: 08/15/2019 Creatinine0.75, BUN19, Potassium3.9, Sodium142, [id number] A complete set of results can be found in Results Review.  Recommendations: Left voice mail with ICM number and encouraged to call if experiencing any fluid symptoms.  Follow-up plan: ICM clinic phone appointment on 01/27/2021. 91 day device clinic remote transmission8/16/2022.  EP/Cardiology Office Visits:03/10/2021 with Dr Harl Bowie. 05/29/2021 with Dr Rayann Heman  Copy of ICM check sent to Dr.Allred   3 month ICM trend: 12/24/2020.    1 Year ICM trend:       Rosalene Billings, RN 12/26/2020 2:55 PM

## 2020-12-31 NOTE — Progress Notes (Signed)
Remote ICD transmission.   

## 2021-01-02 ENCOUNTER — Encounter: Payer: Self-pay | Admitting: Internal Medicine

## 2021-01-19 ENCOUNTER — Other Ambulatory Visit: Payer: Self-pay | Admitting: Cardiology

## 2021-01-22 DIAGNOSIS — R0600 Dyspnea, unspecified: Secondary | ICD-10-CM | POA: Diagnosis not present

## 2021-01-22 DIAGNOSIS — E039 Hypothyroidism, unspecified: Secondary | ICD-10-CM | POA: Diagnosis not present

## 2021-01-27 ENCOUNTER — Ambulatory Visit (INDEPENDENT_AMBULATORY_CARE_PROVIDER_SITE_OTHER): Payer: Medicare Other

## 2021-01-27 DIAGNOSIS — I5022 Chronic systolic (congestive) heart failure: Secondary | ICD-10-CM

## 2021-01-27 DIAGNOSIS — Z95810 Presence of automatic (implantable) cardiac defibrillator: Secondary | ICD-10-CM | POA: Diagnosis not present

## 2021-01-27 NOTE — Progress Notes (Signed)
EPIC Encounter for ICM Monitoring  Patient Name: Kathleen Edwards is a 75 y.o. female Date: 01/27/2021 Primary Care Physican: Glenda Chroman, MD Primary Cardiologist: Branch Electrophysiologist: Allred Bi-V Pacing: 99% 01/27/2021 Weight: 151-152 lbs (baseline 147-148)   AT/AF Burden: <1% (taking Eliquis)                                                            Spoke with patient and heart failure questions reviewed.  Pt reports about 3 lb weight gain in the last week. She has been on vacation during decreased impedance.   CorVue thoracic impedance suggesting possible fluid accumulation starting 01/22/2021 but trending back toward baseline.   Prescribed: Furosemide 20 mg take 1 tablet daily. Potassium 10 mEq take 1 tablet daily   Labs: 12/24/2020 Creatinine 0.73, BUN 21, Potassium 4.0, Sodium 142, GFR 86 A complete set of results can be found in Results Review.   Recommendations:  Advised to take 1 extra furosemide 20 mg and 1 extra Potassium 10 mEq x 1 day and then return to prescribed dosage.   Follow-up plan: ICM clinic phone appointment on 02/04/2021 to recheck fluid levels.   91 day device clinic remote transmission 03/10/2021.    EP/Cardiology Office Visits: 03/10/2021 with Dr Harl Bowie.  05/29/2021 with Dr Rayann Heman   Copy of ICM check sent to Dr. Rayann Heman and Dr Harl Bowie as Juluis Rainier.   3 month ICM trend: 01/27/2021.    1 Year ICM trend:       Rosalene Billings, RN 01/27/2021 8:53 AM

## 2021-02-04 ENCOUNTER — Ambulatory Visit (INDEPENDENT_AMBULATORY_CARE_PROVIDER_SITE_OTHER): Payer: Medicare Other

## 2021-02-04 DIAGNOSIS — I5022 Chronic systolic (congestive) heart failure: Secondary | ICD-10-CM

## 2021-02-04 DIAGNOSIS — Z95810 Presence of automatic (implantable) cardiac defibrillator: Secondary | ICD-10-CM

## 2021-02-06 ENCOUNTER — Telehealth: Payer: Self-pay

## 2021-02-06 NOTE — Telephone Encounter (Signed)
Remote ICM transmission received.  Attempted call to patient regarding ICM remote transmission and left detailed message per DPR.  Advised to return call for any fluid symptoms or questions. Next ICM remote transmission scheduled 03/02/2021.

## 2021-02-06 NOTE — Progress Notes (Signed)
EPIC Encounter for ICM Monitoring  Patient Name: Kathleen Edwards is a 75 y.o. female Date: 02/06/2021 Primary Care Physican: Glenda Chroman, MD Primary Cardiologist: Branch Electrophysiologist: Allred Bi-V Pacing: 99% 01/27/2021 Weight: 151-152 lbs (baseline 147-148)   AT/AF Burden: <1% (taking Eliquis)                                                            Attempted call to patient and unable to reach.  Left detailed message per DPR regarding transmission. Transmission reviewed.     CorVue thoracic impedance suggesting fluid levels returned to normal after taking 1 extra dose of Furosemide.   Prescribed: Furosemide 20 mg take 1 tablet daily. Potassium 10 mEq take 1 tablet daily   Labs: 12/24/2020 Creatinine 0.73, BUN 21, Potassium 4.0, Sodium 142, GFR 86 A complete set of results can be found in Results Review.   Recommendations:  Left voice mail with ICM number and encouraged to call if experiencing any fluid symptoms.   Follow-up plan: ICM clinic phone appointment on 03/02/2021.   91 day device clinic remote transmission 03/10/2021.    EP/Cardiology Office Visits: 03/10/2021 with Dr Harl Bowie.  05/29/2021 with Dr Rayann Heman   Copy of ICM check sent to Dr. Rayann Heman   3 month ICM trend: 02/04/2021.    1 Year ICM trend:       Rosalene Billings, RN 02/06/2021 8:16 AM

## 2021-02-13 DIAGNOSIS — M25561 Pain in right knee: Secondary | ICD-10-CM | POA: Diagnosis not present

## 2021-02-13 DIAGNOSIS — Z299 Encounter for prophylactic measures, unspecified: Secondary | ICD-10-CM | POA: Diagnosis not present

## 2021-02-18 ENCOUNTER — Other Ambulatory Visit: Payer: Self-pay | Admitting: Cardiology

## 2021-02-18 IMAGING — DX DG CHEST 2V
2 series · 2 of 2 positions shown · non-contrast
Comparison: None.

CLINICAL DATA: Defibrillator placed yesterday.

EXAM:
CHEST - 2 VIEW

[chest pa]
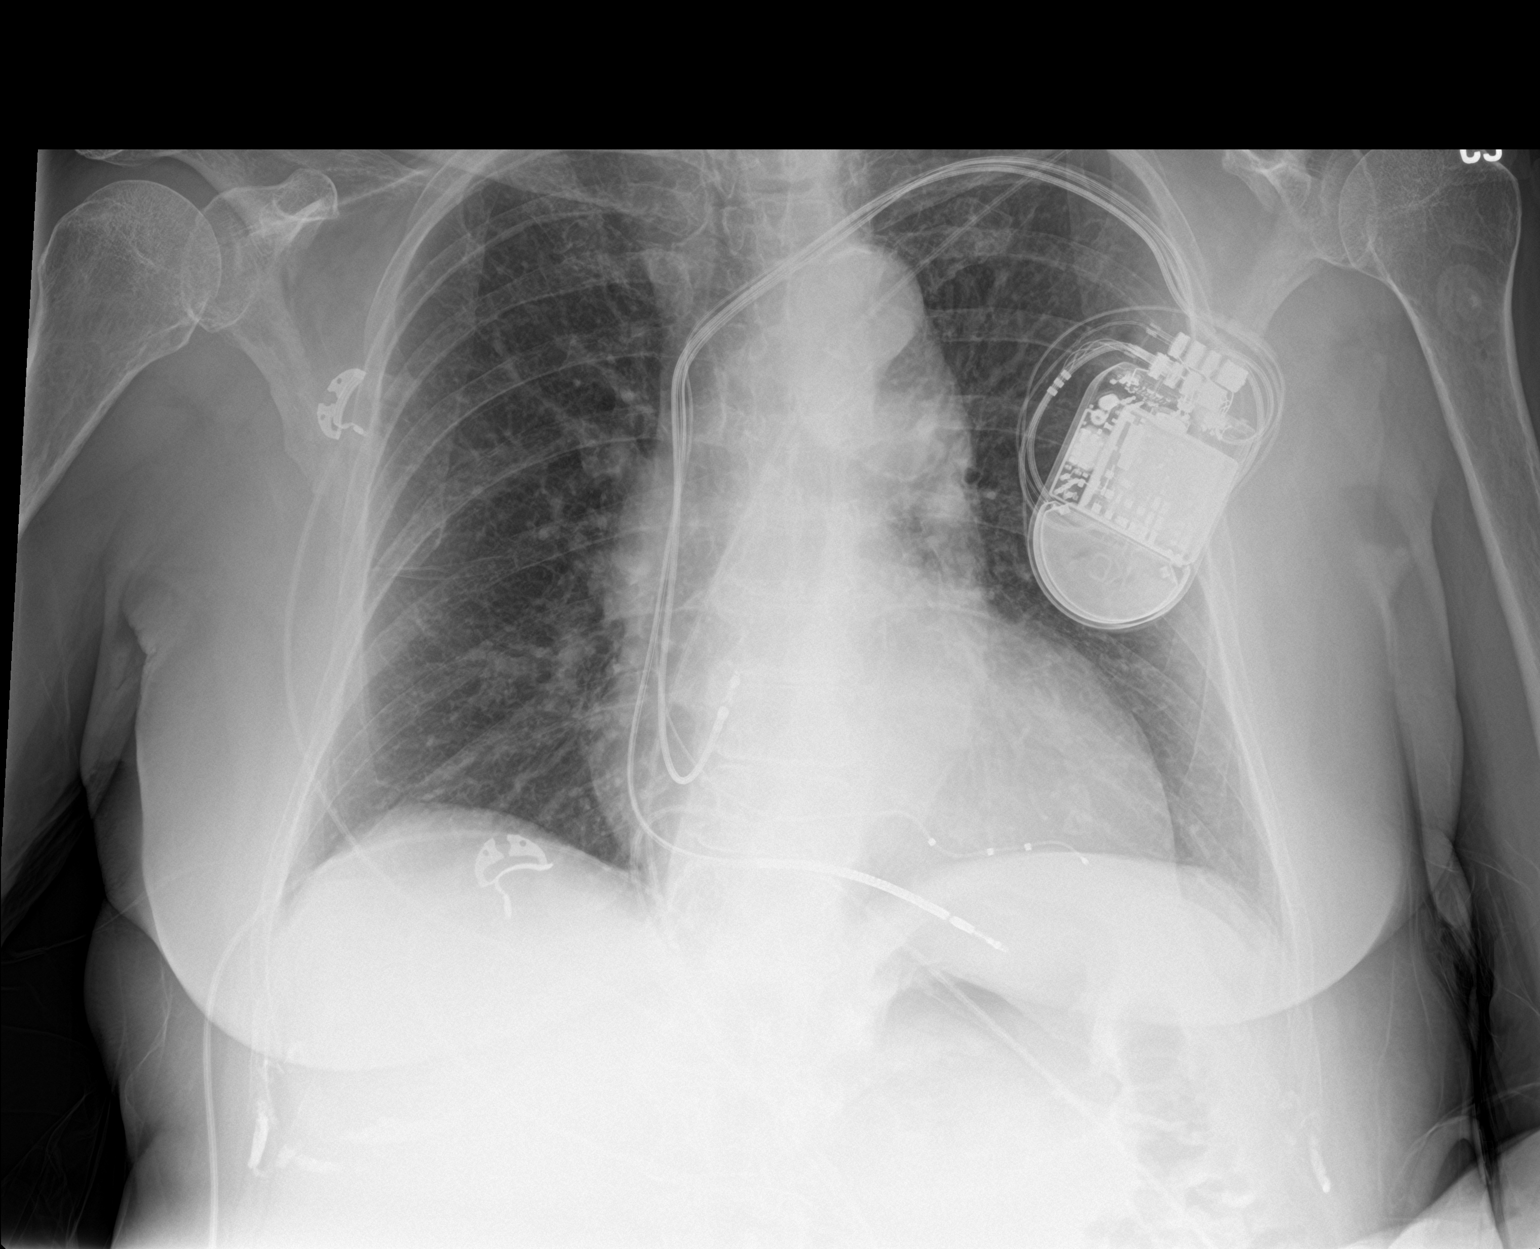

[chest lat]
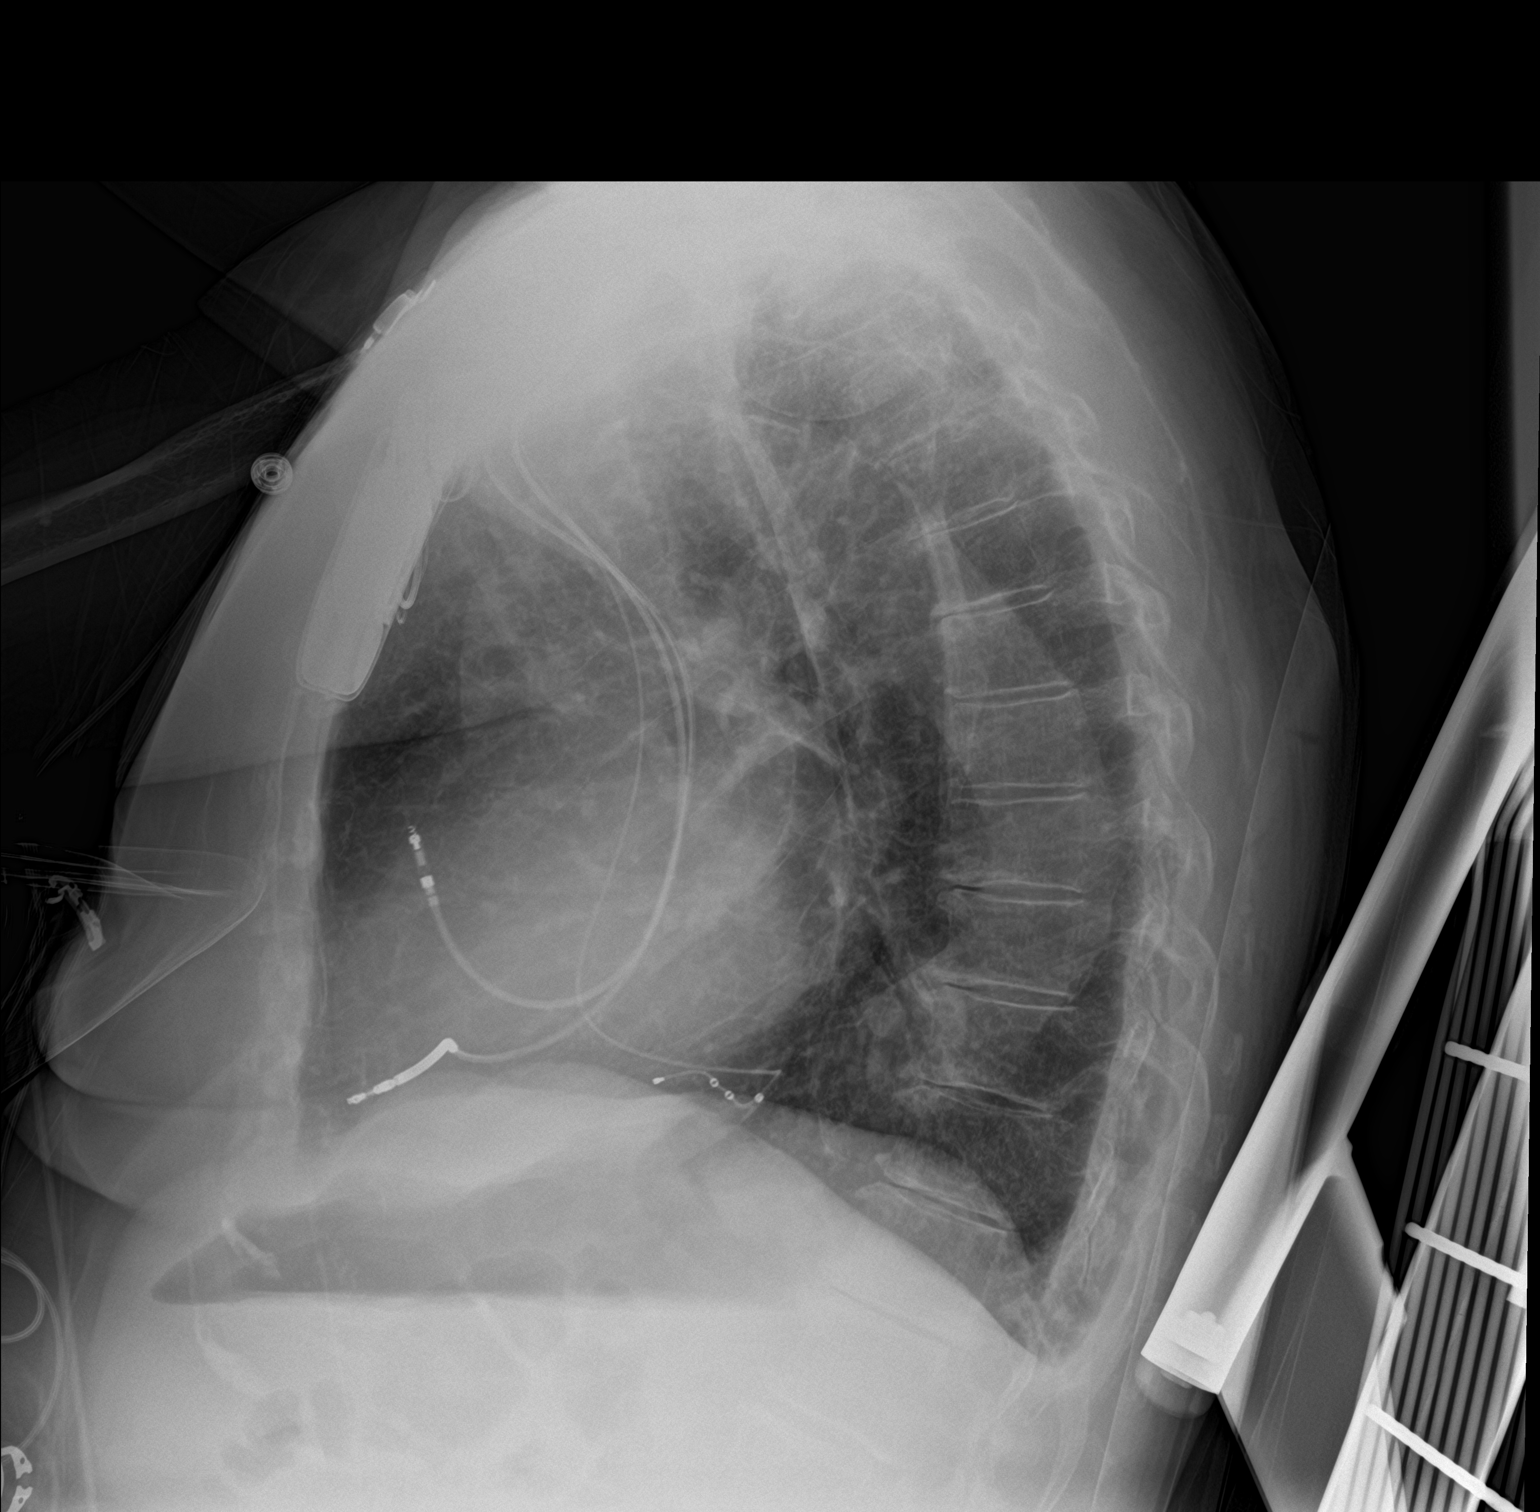

[2 of 2 positions shown; findings below may reference images not displayed]

FINDINGS: Mild cardiomegaly. Aortic atherosclerosis and tortuosity.
Pacemaker/AICD in place with leads in the region of the right
atrium, right ventricle and coronary sinus. No pneumothorax.
Pulmonary vascularity is normal. No edema or effusions. The lungs
are clear.
IMPRESSION: Good appearance following pacemaker/AICD placement. No complicating
feature.

## 2021-02-18 NOTE — Telephone Encounter (Signed)
Prescription refill request for Eliquis received. Indication: PAF Last office visit: 09/16/20 Zandra Abts MD Scr: 0.73 on 12/24/20 Age:  75 Weight: 68 kg  Based on above findings Eliquis '5mg'$  twice daily is the appropriate dose.  Refill approved.

## 2021-02-22 DIAGNOSIS — E039 Hypothyroidism, unspecified: Secondary | ICD-10-CM | POA: Diagnosis not present

## 2021-02-22 DIAGNOSIS — R0600 Dyspnea, unspecified: Secondary | ICD-10-CM | POA: Diagnosis not present

## 2021-03-02 ENCOUNTER — Ambulatory Visit (INDEPENDENT_AMBULATORY_CARE_PROVIDER_SITE_OTHER): Payer: Medicare Other

## 2021-03-02 DIAGNOSIS — Z95810 Presence of automatic (implantable) cardiac defibrillator: Secondary | ICD-10-CM

## 2021-03-02 DIAGNOSIS — I5022 Chronic systolic (congestive) heart failure: Secondary | ICD-10-CM

## 2021-03-04 ENCOUNTER — Telehealth: Payer: Self-pay

## 2021-03-04 NOTE — Telephone Encounter (Signed)
Remote ICM transmission received.  Attempted call to patient regarding ICM remote transmission and no answer or voice mail option.  

## 2021-03-04 NOTE — Progress Notes (Signed)
EPIC Encounter for ICM Monitoring  Patient Name: Kathleen Edwards is a 75 y.o. female Date: 03/04/2021 Primary Care Physican: Glenda Chroman, MD Primary Cardiologist: Branch Electrophysiologist: Allred Bi-V Pacing: 99% 01/27/2021 Weight: 151-152 lbs (baseline 147-148)   AT/AF Burden: <1% (taking Eliquis)                                                            Attempted call to patient and unable to reach.  Transmission reviewed.    CorVue thoracic impedance suggesting normal fluid levels.   Prescribed: Furosemide 20 mg take 1 tablet daily. Potassium 10 mEq take 1 tablet daily   Labs: 12/24/2020 Creatinine 0.73, BUN 21, Potassium 4.0, Sodium 142, GFR 86 A complete set of results can be found in Results Review.   Recommendations:  Unable to reach.   Follow-up plan: ICM clinic phone appointment on 04/06/2021.   91 day device clinic remote transmission 03/10/2021.    EP/Cardiology Office Visits: 03/10/2021 with Dr Harl Bowie.  05/29/2021 with Dr Rayann Heman   Copy of ICM check sent to Dr. Rayann Heman.   3 month ICM trend: 03/02/2021.    1 Year ICM trend:       Rosalene Billings, RN 03/04/2021 4:13 PM

## 2021-03-10 ENCOUNTER — Encounter: Payer: Self-pay | Admitting: Cardiology

## 2021-03-10 ENCOUNTER — Ambulatory Visit: Payer: Medicare Other | Admitting: Cardiology

## 2021-03-10 ENCOUNTER — Ambulatory Visit (INDEPENDENT_AMBULATORY_CARE_PROVIDER_SITE_OTHER): Payer: Medicare Other

## 2021-03-10 ENCOUNTER — Encounter: Payer: Self-pay | Admitting: *Deleted

## 2021-03-10 VITALS — BP 122/68 | HR 60 | Ht 63.0 in | Wt 150.0 lb

## 2021-03-10 DIAGNOSIS — I480 Paroxysmal atrial fibrillation: Secondary | ICD-10-CM | POA: Diagnosis not present

## 2021-03-10 DIAGNOSIS — I5022 Chronic systolic (congestive) heart failure: Secondary | ICD-10-CM | POA: Diagnosis not present

## 2021-03-10 DIAGNOSIS — I428 Other cardiomyopathies: Secondary | ICD-10-CM

## 2021-03-10 DIAGNOSIS — I2510 Atherosclerotic heart disease of native coronary artery without angina pectoris: Secondary | ICD-10-CM | POA: Diagnosis not present

## 2021-03-10 LAB — CUP PACEART REMOTE DEVICE CHECK
Battery Remaining Longevity: 54 mo
Battery Remaining Percentage: 68 %
Battery Voltage: 2.96 V
Brady Statistic AP VP Percent: 17 %
Brady Statistic AP VS Percent: 1 %
Brady Statistic AS VP Percent: 82 %
Brady Statistic AS VS Percent: 1 %
Brady Statistic RA Percent Paced: 17 %
Date Time Interrogation Session: 20220816020018
HighPow Impedance: 65 Ohm
HighPow Impedance: 65 Ohm
Implantable Lead Implant Date: 20200218
Implantable Lead Implant Date: 20200218
Implantable Lead Implant Date: 20200218
Implantable Lead Location: 753858
Implantable Lead Location: 753859
Implantable Lead Location: 753860
Implantable Pulse Generator Implant Date: 20200218
Lead Channel Impedance Value: 1075 Ohm
Lead Channel Impedance Value: 380 Ohm
Lead Channel Impedance Value: 550 Ohm
Lead Channel Pacing Threshold Amplitude: 0.625 V
Lead Channel Pacing Threshold Amplitude: 1.125 V
Lead Channel Pacing Threshold Amplitude: 2.875 V
Lead Channel Pacing Threshold Pulse Width: 0.4 ms
Lead Channel Pacing Threshold Pulse Width: 0.5 ms
Lead Channel Pacing Threshold Pulse Width: 0.7 ms
Lead Channel Sensing Intrinsic Amplitude: 0.6 mV
Lead Channel Sensing Intrinsic Amplitude: 6.8 mV
Lead Channel Setting Pacing Amplitude: 2 V
Lead Channel Setting Pacing Amplitude: 2.125
Lead Channel Setting Pacing Amplitude: 3.375
Lead Channel Setting Pacing Pulse Width: 0.4 ms
Lead Channel Setting Pacing Pulse Width: 0.7 ms
Lead Channel Setting Sensing Sensitivity: 0.5 mV
Pulse Gen Serial Number: 9880584

## 2021-03-10 NOTE — Progress Notes (Signed)
Clinical Summary Kathleen Edwards is a 75 y.o.femaleseen today for follow up fo the following medical problems.    1. Chronic systolic HF now with normalized LVEF - admitted 01/17/18 with new diagnosis of systolic HF.  - from notes presented with hypertensive emergency, started on nitro gtt and diuresed - EKG LBBB,  - 12/2017 echo [facility]: LVEF 20%  - 12/2017 cath with mild to moderate CAD. CI 3.2, PCWP 10, LVEDP 14. Overall normal CO and filling pressures   Jan 2020 echo: LVEF 20-25%.    08/2018 BiV AICD placed, followed by EP - normal device check 11/2020   03/2019 echo: LVEF 55-60%,     - no SOB/DOE/no LE edema - compliant with meds     2. PAF -  noted device checks 11/2018 and 02/2019 - CHADS2Vasc score is 5 (CHF, HTN, age x1, CAD,gender)   -no recent palpitations - no bleeding on eliquis   3. CAD - 12/2017 cath as reported below, overall mild to moderate disease - denies any chest pains.        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   Previously retired at [employer].    Past Medical History:  Diagnosis Date   Acquired absence of both cervix and uterus    Anxiety disorder    Asthma    Degenerative joint disease of hand    Diverticulosis    Fatigue    H/O: osteoarthritis    History of vitamin A deficiency    Hyperlipidemia    Hypertension    Keratosis, seborrheic    Lipoma    Ovarian failure    Rhinitis, allergic    Vitamin D deficiency      Allergies  Allergen Reactions   Lisinopril Cough   Vioxx [Rofecoxib] Other (See Comments)    Epigastric discomfort      Current Outpatient Medications  Medication Sig Dispense Refill   apixaban (ELIQUIS) 5 MG TABS tablet TAKE 1 TABLET BY MOUTH TWICE DAILY 60 tablet 5   Cholecalciferol (VITAMIN D PO) Take 1 capsule by mouth daily.     ENTRESTO 97-103 MG TAKE 1 TABLET BY MOUTH TWICE DAILY 60 tablet 3   furosemide (LASIX) 20 MG tablet TAKE 1 TABLET BY MOUTH DAILY 90 tablet 3    metoprolol succinate (TOPROL-XL) 25 MG 24 hr tablet TAKE 1 AND 1/2 TABLETS BY MOUTH DAILY 135 tablet 3   potassium chloride (K-DUR) 10 MEQ tablet Take 10 mEq by mouth daily.     pravastatin (PRAVACHOL) 40 MG tablet Take 40 mg by mouth daily.     No current facility-administered medications for this visit.     Past Surgical History:  Procedure Laterality Date   BIV ICD INSERTION CRT-D N/A 09/12/2018   Procedure: BIV ICD INSERTION CRT-D;  Surgeon: Thompson Grayer, MD;  Location: [place] [facility];  Service: Cardiovascular;  Laterality: N/A;   RIGHT/LEFT HEART CATH AND CORONARY ANGIOGRAPHY N/A 02/15/2018   Procedure: RIGHT/LEFT HEART CATH AND CORONARY ANGIOGRAPHY;  Surgeon: Troy Sine, MD;  Location: [place] [facility];  Service: Cardiovascular;  Laterality: N/A;   TOTAL ABDOMINAL HYSTERECTOMY  1979     Allergies  Allergen Reactions   Lisinopril Cough   Vioxx [Rofecoxib] Other (See Comments)    Epigastric discomfort       Family History  Problem Relation Age of Onset   CVA Mother    Brain cancer Father  Social History Ms. Kathleen Edwards reports that she quit smoking about 22 years ago. She has never used smokeless tobacco. Ms. Kathleen Edwards reports that she does not currently use alcohol.   Review of Systems CONSTITUTIONAL: No weight loss, fever, chills, weakness or fatigue.  HEENT: Eyes: No visual loss, blurred vision, double vision or yellow sclerae.No hearing loss, sneezing, congestion, runny nose or sore throat.  SKIN: No rash or itching.  CARDIOVASCULAR: per hpi RESPIRATORY: No shortness of breath, cough or sputum.  GASTROINTESTINAL: No anorexia, nausea, vomiting or diarrhea. No abdominal pain or blood.  GENITOURINARY: No burning on urination, no polyuria NEUROLOGICAL: No headache, dizziness, syncope, paralysis, ataxia, numbness or tingling in the extremities. No change in bowel or bladder control.  MUSCULOSKELETAL: No muscle, back pain, joint pain or stiffness.   LYMPHATICS: No enlarged nodes. No history of splenectomy.  PSYCHIATRIC: No history of depression or anxiety.  ENDOCRINOLOGIC: No reports of sweating, cold or heat intolerance. No polyuria or polydipsia.  Marland Kitchen   Physical Examination Today's Vitals   03/10/21 0820  BP: 122/68  Pulse: 60  SpO2: 99%  Weight: 150 lb (68 kg)  Height: '5\' 3"'$  (1.6 m)   Body mass index is 26.57 kg/m.  Gen: resting comfortably, no acute distress HEENT: no scleral icterus, pupils equal round and reactive, no palptable cervical adenopathy,  CV: RRR, no m/r/g, no jve Resp: Clear to auscultation bilaterally GI: abdomen is soft, non-tender, non-distended, normal bowel sounds, no hepatosplenomegaly MSK: extremities are warm, no edema.  Skin: warm, no rash Neuro:  no focal deficits Psych: appropriate affect   Diagnostic Studies  12/2017 cath Dist RCA lesion is 55% stenosed. Mid LM to Dist LM lesion is 20% stenosed. Ost Cx lesion is 20% stenosed. Prox LAD lesion is 30% stenosed. Prox RCA lesion is 20% stenosed. Mid RCA lesion is 30% stenosed.   There is evidence for diffuse coronary calcification involving all coronary arteries.   Left main stenosis with distal 20% narrowing; proximal irregularity of the LAD with 30% proximal stenosis; 20% ostial stenosis of the left circumflex vessel; and very large dominant RCA with mild to moderate calcification and irregularity with 20% proximal 30% mid and 50 to 60% smooth stenosis immediately proximal to the takeoff of a large PDA and PLA vessel with 20% PLA narrowing.   RECOMMENDATION: Guideline directed medical therapy for the patient's cardiomyopathy which most likely is of nonischemic etiology.     Jan 2020 echo Study Conclusions   - Procedure narrative: Transthoracic echocardiography. Image   quality was fair. The study was technically difficult, as a   result of poor acoustic windows and body habitus. - Left ventricle: The cavity size was normal. Wall  thickness was   normal. Systolic function was severely reduced. The estimated   ejection fraction was in the range of 20% to 25%. Diffuse   hypokinesis. Doppler parameters are consistent with abnormal left   ventricular relaxation (grade 1 diastolic dysfunction). - Ventricular septum: Septal motion showed abnormal function and   dyssynergy. These changes are consistent with a left bundle   Kathleen Edwards block. - Mitral valve: Mildly to moderately calcified annulus. There was   mild regurgitation. - Left atrium: The atrium was mildly dilated. - Atrial septum: No defect or patent foramen ovale was identified.     Assessment and Plan   1 Chronic systolic HF - LVEF has most recently normalized - no symptoms, continue current meds   2. PAF - no symptoms, continue current meds including eliquis   3.  CAD - no symptoms, continue current meds  Request pcp labs  F/u 6 months    Arnoldo Lenis, M.D.

## 2021-03-10 NOTE — Patient Instructions (Signed)
Medication Instructions:  Continue all current medications.   Labwork: none  Testing/Procedures: none  Follow-Up: 6 months   Any Other Special Instructions Will Be Listed Below (If Applicable).   If you need a refill on your cardiac medications before your next appointment, please call your pharmacy.  

## 2021-03-20 ENCOUNTER — Other Ambulatory Visit: Payer: Self-pay | Admitting: Cardiology

## 2021-03-31 NOTE — Progress Notes (Signed)
Remote ICD transmission.   

## 2021-04-06 ENCOUNTER — Ambulatory Visit (INDEPENDENT_AMBULATORY_CARE_PROVIDER_SITE_OTHER): Payer: Medicare Other

## 2021-04-06 DIAGNOSIS — Z95810 Presence of automatic (implantable) cardiac defibrillator: Secondary | ICD-10-CM

## 2021-04-06 DIAGNOSIS — I5022 Chronic systolic (congestive) heart failure: Secondary | ICD-10-CM

## 2021-04-07 ENCOUNTER — Telehealth: Payer: Self-pay

## 2021-04-07 NOTE — Progress Notes (Signed)
EPIC Encounter for ICM Monitoring  Patient Name: Kathleen Edwards is a 75 y.o. female Date: 04/07/2021 Primary Care Physican: Glenda Chroman, MD Primary Cardiologist: Branch Electrophysiologist: Allred Bi-V Pacing: 99% 01/27/2021 Weight: 151-152 lbs (baseline 147-148)   AT/AF Burden: <1% (taking Eliquis)                                                            Attempted call to patient and unable to reach.  Left detailed message per DPR regarding transmission. Transmission reviewed.     CorVue thoracic impedance suggesting possible fluid accumulation starting 9/10.   Prescribed: Furosemide 20 mg take 1 tablet daily. Potassium 10 mEq take 1 tablet daily   Labs: 12/24/2020 Creatinine 0.73, BUN 21, Potassium 4.0, Sodium 142, GFR 86 A complete set of results can be found in Results Review.   Recommendations:  Left voice mail with ICM number and encouraged to call if experiencing any fluid symptoms.   Follow-up plan: ICM clinic phone appointment on 04/14/2021 to recheck fluid levels.   91 day device clinic remote transmission 06/09/2021.    EP/Cardiology Office Visits: 09/17/2021 with Dr Harl Bowie.  05/29/2021 with Dr Rayann Heman   Copy of ICM check sent to Dr. Rayann Heman.  Will send to Dr Harl Bowie for review if patient is reached.    3 month ICM trend: 04/06/2021.    1 Year ICM trend:       Rosalene Billings, RN 04/07/2021 10:29 AM

## 2021-04-07 NOTE — Telephone Encounter (Signed)
Remote ICM transmission received.  Attempted call to patient regarding ICM remote transmission and left detailed message per DPR.  Advised to return call for any fluid symptoms or questions. Next ICM remote transmission scheduled 04/14/2021.

## 2021-04-14 ENCOUNTER — Ambulatory Visit (INDEPENDENT_AMBULATORY_CARE_PROVIDER_SITE_OTHER): Payer: Medicare Other

## 2021-04-14 DIAGNOSIS — Z95810 Presence of automatic (implantable) cardiac defibrillator: Secondary | ICD-10-CM

## 2021-04-14 DIAGNOSIS — I5022 Chronic systolic (congestive) heart failure: Secondary | ICD-10-CM

## 2021-04-15 NOTE — Progress Notes (Signed)
EPIC Encounter for ICM Monitoring  Patient Name: Kathleen Edwards is a 75 y.o. female Date: 04/15/2021 Primary Care Physican: Glenda Chroman, MD Primary Cardiologist: Branch Electrophysiologist: Allred Bi-V Pacing: 99% 01/27/2021 Weight: 151-152 lbs (baseline 147-148)   AT/AF Burden: <1% (taking Eliquis)                                                            Transmission reviewed.     CorVue thoracic impedance suggesting fluid levels returned to normal.   Prescribed: Furosemide 20 mg take 1 tablet daily. Potassium 10 mEq take 1 tablet daily   Labs: 12/24/2020 Creatinine 0.73, BUN 21, Potassium 4.0, Sodium 142, GFR 86 A complete set of results can be found in Results Review.   Recommendations:  No changes   Follow-up plan: ICM clinic phone appointment on 05/18/2021.   91 day device clinic remote transmission 06/09/2021.    EP/Cardiology Office Visits: 09/17/2021 with Dr Harl Bowie.  05/29/2021 with Dr Rayann Heman   Copy of ICM check sent to Dr. Rayann Heman.   3 month ICM trend: 04/14/2021.    1 Year ICM trend:       Rosalene Billings, RN 04/15/2021 1:42 PM

## 2021-04-24 DIAGNOSIS — E7800 Pure hypercholesterolemia, unspecified: Secondary | ICD-10-CM | POA: Diagnosis not present

## 2021-04-24 DIAGNOSIS — D179 Benign lipomatous neoplasm, unspecified: Secondary | ICD-10-CM | POA: Diagnosis not present

## 2021-05-17 ENCOUNTER — Other Ambulatory Visit: Payer: Self-pay | Admitting: Cardiology

## 2021-05-18 ENCOUNTER — Ambulatory Visit (INDEPENDENT_AMBULATORY_CARE_PROVIDER_SITE_OTHER): Payer: Medicare Other

## 2021-05-18 DIAGNOSIS — Z95810 Presence of automatic (implantable) cardiac defibrillator: Secondary | ICD-10-CM

## 2021-05-18 DIAGNOSIS — I5022 Chronic systolic (congestive) heart failure: Secondary | ICD-10-CM

## 2021-05-20 ENCOUNTER — Telehealth: Payer: Self-pay

## 2021-05-20 NOTE — Progress Notes (Signed)
EPIC Encounter for ICM Monitoring  Patient Name: Kathleen Edwards is a 75 y.o. female Date: 05/20/2021 Primary Care Physican: Glenda Chroman, MD Primary Cardiologist: Branch Electrophysiologist: Allred Bi-V Pacing: 99% 01/27/2021 Weight: 151-152 lbs (baseline 147-148)   AT/AF Burden: <1% (taking Eliquis)                                                            Attempted call to patient and unable to reach.  Left message to return call.     CorVue thoracic impedance suggesting normal fluid levels.   Prescribed: Furosemide 20 mg take 1 tablet daily. Potassium 10 mEq take 1 tablet daily   Labs: 12/24/2020 Creatinine 0.73, BUN 21, Potassium 4.0, Sodium 142, GFR 86 A complete set of results can be found in Results Review.   Recommendations:  Unable to reach.     Follow-up plan: ICM clinic phone appointment on 06/29/2021.   91 day device clinic remote transmission 06/09/2021.    EP/Cardiology Office Visits: 09/17/2021 with Dr Harl Bowie.  05/29/2021 with Dr Rayann Heman   Copy of ICM check sent to Dr. Rayann Heman.   3 month ICM trend: 05/18/2021.    1 Year ICM trend:       Rosalene Billings, RN 05/20/2021 10:19 AM

## 2021-05-20 NOTE — Telephone Encounter (Signed)
Remote ICM transmission received.  Attempted call to patient regarding ICM remote transmission and left message to return call   

## 2021-05-29 ENCOUNTER — Encounter: Payer: Medicare Other | Admitting: Internal Medicine

## 2021-05-29 ENCOUNTER — Ambulatory Visit: Payer: Medicare Other | Admitting: Internal Medicine

## 2021-05-29 VITALS — BP 132/70 | HR 69 | Ht 63.0 in | Wt 149.2 lb

## 2021-05-29 DIAGNOSIS — I480 Paroxysmal atrial fibrillation: Secondary | ICD-10-CM | POA: Diagnosis not present

## 2021-05-29 DIAGNOSIS — I428 Other cardiomyopathies: Secondary | ICD-10-CM | POA: Diagnosis not present

## 2021-05-29 LAB — CUP PACEART INCLINIC DEVICE CHECK
Battery Remaining Longevity: 52 mo
Brady Statistic RA Percent Paced: 18 %
Brady Statistic RV Percent Paced: 99 %
Date Time Interrogation Session: 20221104133807
HighPow Impedance: 68.625
Implantable Lead Implant Date: 20200218
Implantable Lead Implant Date: 20200218
Implantable Lead Implant Date: 20200218
Implantable Lead Location: 753858
Implantable Lead Location: 753859
Implantable Lead Location: 753860
Implantable Pulse Generator Implant Date: 20200218
Lead Channel Impedance Value: 1212.5 Ohm
Lead Channel Impedance Value: 375 Ohm
Lead Channel Impedance Value: 600 Ohm
Lead Channel Pacing Threshold Amplitude: 0.625 V
Lead Channel Pacing Threshold Amplitude: 0.875 V
Lead Channel Pacing Threshold Amplitude: 2.375 V
Lead Channel Pacing Threshold Pulse Width: 0.4 ms
Lead Channel Pacing Threshold Pulse Width: 0.5 ms
Lead Channel Pacing Threshold Pulse Width: 0.7 ms
Lead Channel Sensing Intrinsic Amplitude: 1.6 mV
Lead Channel Sensing Intrinsic Amplitude: 9.2 mV
Lead Channel Setting Pacing Amplitude: 1.875
Lead Channel Setting Pacing Amplitude: 2 V
Lead Channel Setting Pacing Amplitude: 2.875
Lead Channel Setting Pacing Pulse Width: 0.4 ms
Lead Channel Setting Pacing Pulse Width: 0.7 ms
Lead Channel Setting Sensing Sensitivity: 0.5 mV
Pulse Gen Serial Number: 9880584

## 2021-05-29 NOTE — Patient Instructions (Signed)
Medication Instructions:  Continue all current medications.  Labwork: none  Testing/Procedures: none  Follow-Up: 1 year - Dr.  Allred   Any Other Special Instructions Will Be Listed Below (If Applicable).   If you need a refill on your cardiac medications before your next appointment, please call your pharmacy.  

## 2021-05-29 NOTE — Progress Notes (Signed)
PCP: Glenda Chroman, MD Primary Cardiologist: Dr Harl Bowie Primary EP: Dr Rayann Heman  Kathleen Edwards is a 75 y.o. female who presents today for routine electrophysiology followup.  Since last being seen in our clinic, the patient reports doing very well.  Today, she denies symptoms of palpitations, chest pain, shortness of breath,  lower extremity edema, dizziness, presyncope, syncope, or ICD shocks.  The patient is otherwise without complaint today.   Past Medical History:  Diagnosis Date   Acquired absence of both cervix and uterus    Anxiety disorder    Asthma    Degenerative joint disease of hand    Diverticulosis    Fatigue    H/O: osteoarthritis    History of vitamin A deficiency    Hyperlipidemia    Hypertension    Keratosis, seborrheic    Lipoma    Ovarian failure    Rhinitis, allergic    Vitamin D deficiency    Past Surgical History:  Procedure Laterality Date   BIV ICD INSERTION CRT-D N/A 09/12/2018   Procedure: BIV ICD INSERTION CRT-D;  Surgeon: Thompson Grayer, MD;  Location: [place] [facility];  Service: Cardiovascular;  Laterality: N/A;   RIGHT/LEFT HEART CATH AND CORONARY ANGIOGRAPHY N/A 02/15/2018   Procedure: RIGHT/LEFT HEART CATH AND CORONARY ANGIOGRAPHY;  Surgeon: Troy Sine, MD;  Location: [place] [facility];  Service: Cardiovascular;  Laterality: N/A;   TOTAL ABDOMINAL HYSTERECTOMY  1979    ROS- all systems are reviewed and negative except as per HPI above  Current Outpatient Medications  Medication Sig Dispense Refill   apixaban (ELIQUIS) 5 MG TABS tablet TAKE 1 TABLET BY MOUTH TWICE DAILY 60 tablet 5   Cholecalciferol (VITAMIN D PO) Take 1 capsule by mouth daily.     ENTRESTO 97-103 MG TAKE 1 TABLET BY MOUTH TWICE DAILY 60 tablet 3   furosemide (LASIX) 20 MG tablet TAKE 1 TABLET BY MOUTH DAILY 90 tablet 3   metoprolol succinate (TOPROL-XL) 25 MG 24 hr tablet TAKE 1 AND 1/2 TABLETS BY MOUTH DAILY 135 tablet 3   potassium chloride (K-DUR) 10 MEQ tablet  Take 10 mEq by mouth daily.     pravastatin (PRAVACHOL) 40 MG tablet Take 40 mg by mouth daily.     No current facility-administered medications for this visit.    Physical Exam: Vitals:   05/29/21 1326  BP: 132/70  Pulse: 69  SpO2: 97%  Weight: 149 lb 3.2 oz (67.7 kg)  Height: 5\' 3"  (1.6 m)    GEN- The patient is well appearing, alert and oriented x 3 today.   Head- normocephalic, atraumatic Eyes-  Sclera clear, conjunctiva pink Ears- hearing intact Oropharynx- clear Lungs- Clear to ausculation bilaterally, normal work of breathing Chest- ICD pocket is well healed Heart- Regular rate and rhythm, no murmurs, rubs or gallops, PMI not laterally displaced GI- soft, NT, ND, + BS Extremities- no clubbing, cyanosis, or edema  ICD interrogation- reviewed in detail today,  See PACEART report  ekg tracing ordered today is personally reviewed and shows sinus with BiV pacing  Wt Readings from Last 3 Encounters:  05/29/21 149 lb 3.2 oz (67.7 kg)  03/10/21 150 lb (68 kg)  09/16/20 150 lb (68 kg)    Assessment and Plan:  1.  Chronic systolic dysfunction/ nonischemic CM euvolemic today Stable on an appropriate medical regimen Normal BiV ICD function EF has recovered with CRT See Pace Art report No changes today she is not device dependant today followed in  ICM device clinic  2. Paroxysmal atrial fibrillation Burden remains <1% Chads2vasc score is 4.  She is on eliquis  Return in a year  Thompson Grayer MD, [facility] 05/29/2021 1:28 PM

## 2021-06-01 ENCOUNTER — Telehealth: Payer: Self-pay | Admitting: Internal Medicine

## 2021-06-01 MED ORDER — FUROSEMIDE 20 MG PO TABS
20.0000 mg | ORAL_TABLET | Freq: Every day | ORAL | 3 refills | Status: DC
Start: 2021-06-01 — End: 2022-07-12

## 2021-06-01 NOTE — Telephone Encounter (Signed)
*  STAT* If patient is at the pharmacy, call can be transferred to refill team.   1. Which medications need to be refilled? (please list name of each medication and dose if known)   furosemide (LASIX) 20 MG tablet [[id number]]   2. Which pharmacy/location (including street and city if local pharmacy) is medication to be sent to?  [facility]  3. Do they need a 30 day or 90 day supply?   90 day  Pt is completely out

## 2021-06-01 NOTE — Telephone Encounter (Signed)
done

## 2021-06-09 ENCOUNTER — Ambulatory Visit (INDEPENDENT_AMBULATORY_CARE_PROVIDER_SITE_OTHER): Payer: Medicare Other

## 2021-06-09 DIAGNOSIS — I428 Other cardiomyopathies: Secondary | ICD-10-CM | POA: Diagnosis not present

## 2021-06-09 LAB — CUP PACEART REMOTE DEVICE CHECK
Battery Remaining Longevity: 52 mo
Battery Remaining Percentage: 65 %
Battery Voltage: 2.96 V
Brady Statistic AP VP Percent: 25 %
Brady Statistic AP VS Percent: 1 %
Brady Statistic AS VP Percent: 75 %
Brady Statistic AS VS Percent: 1 %
Brady Statistic RA Percent Paced: 25 %
Date Time Interrogation Session: 20221115020015
HighPow Impedance: 66 Ohm
HighPow Impedance: 66 Ohm
Implantable Lead Implant Date: 20200218
Implantable Lead Implant Date: 20200218
Implantable Lead Implant Date: 20200218
Implantable Lead Location: 753858
Implantable Lead Location: 753859
Implantable Lead Location: 753860
Implantable Pulse Generator Implant Date: 20200218
Lead Channel Impedance Value: 1100 Ohm
Lead Channel Impedance Value: 400 Ohm
Lead Channel Impedance Value: 550 Ohm
Lead Channel Pacing Threshold Amplitude: 0.625 V
Lead Channel Pacing Threshold Amplitude: 1 V
Lead Channel Pacing Threshold Amplitude: 2.75 V
Lead Channel Pacing Threshold Pulse Width: 0.4 ms
Lead Channel Pacing Threshold Pulse Width: 0.5 ms
Lead Channel Pacing Threshold Pulse Width: 0.7 ms
Lead Channel Sensing Intrinsic Amplitude: 0.9 mV
Lead Channel Sensing Intrinsic Amplitude: 9.2 mV
Lead Channel Setting Pacing Amplitude: 2 V
Lead Channel Setting Pacing Amplitude: 2 V
Lead Channel Setting Pacing Amplitude: 3.25 V
Lead Channel Setting Pacing Pulse Width: 0.4 ms
Lead Channel Setting Pacing Pulse Width: 0.7 ms
Lead Channel Setting Sensing Sensitivity: 0.5 mV
Pulse Gen Serial Number: 9880584

## 2021-06-17 NOTE — Progress Notes (Signed)
Remote ICD transmission.   

## 2021-06-29 ENCOUNTER — Ambulatory Visit (INDEPENDENT_AMBULATORY_CARE_PROVIDER_SITE_OTHER): Payer: Medicare Other

## 2021-06-29 DIAGNOSIS — I5022 Chronic systolic (congestive) heart failure: Secondary | ICD-10-CM | POA: Diagnosis not present

## 2021-06-29 DIAGNOSIS — Z95810 Presence of automatic (implantable) cardiac defibrillator: Secondary | ICD-10-CM | POA: Diagnosis not present

## 2021-07-03 NOTE — Progress Notes (Signed)
EPIC Encounter for ICM Monitoring  Patient Name: Kathleen Edwards is a 75 y.o. female Date: 07/03/2021 Primary Care Physican: Glenda Chroman, MD Primary Cardiologist: Branch Electrophysiologist: Allred Bi-V Pacing: 99% 05/29/2021 Weight: 149 lbs (baseline 147-148)   AT/AF Burden: <1% (taking Eliquis)                                                            Transmission reviewed.    CorVue thoracic impedance suggesting normal fluid levels.   Prescribed: Furosemide 20 mg take 1 tablet daily. Potassium 10 mEq take 1 tablet daily   Labs: 12/24/2020 Creatinine 0.73, BUN 21, Potassium 4.0, Sodium 142, GFR 86 A complete set of results can be found in Results Review.   Recommendations:  Unable to reach.     Follow-up plan: ICM clinic phone appointment on 08/03/2021.   91 day device clinic remote transmission 09/08/2021.    EP/Cardiology Office Visits: 09/17/2021 with Dr Harl Bowie.     Copy of ICM check sent to Dr. Rayann Heman.   3 month ICM trend: 06/29/2021.    12-14 Month ICM trend:       Rosalene Billings, RN 07/03/2021 10:03 AM

## 2021-07-09 DIAGNOSIS — I10 Essential (primary) hypertension: Secondary | ICD-10-CM | POA: Diagnosis not present

## 2021-07-09 DIAGNOSIS — R030 Elevated blood-pressure reading, without diagnosis of hypertension: Secondary | ICD-10-CM | POA: Diagnosis not present

## 2021-07-09 DIAGNOSIS — M545 Low back pain, unspecified: Secondary | ICD-10-CM | POA: Diagnosis not present

## 2021-07-09 DIAGNOSIS — I5020 Unspecified systolic (congestive) heart failure: Secondary | ICD-10-CM | POA: Diagnosis not present

## 2021-07-09 DIAGNOSIS — Z299 Encounter for prophylactic measures, unspecified: Secondary | ICD-10-CM | POA: Diagnosis not present

## 2021-07-09 DIAGNOSIS — M549 Dorsalgia, unspecified: Secondary | ICD-10-CM | POA: Diagnosis not present

## 2021-07-11 DIAGNOSIS — M545 Low back pain, unspecified: Secondary | ICD-10-CM | POA: Diagnosis not present

## 2021-07-24 DIAGNOSIS — E7800 Pure hypercholesterolemia, unspecified: Secondary | ICD-10-CM | POA: Diagnosis not present

## 2021-07-24 DIAGNOSIS — D179 Benign lipomatous neoplasm, unspecified: Secondary | ICD-10-CM | POA: Diagnosis not present

## 2021-08-03 ENCOUNTER — Ambulatory Visit (INDEPENDENT_AMBULATORY_CARE_PROVIDER_SITE_OTHER): Payer: Medicare Other

## 2021-08-03 DIAGNOSIS — Z95810 Presence of automatic (implantable) cardiac defibrillator: Secondary | ICD-10-CM | POA: Diagnosis not present

## 2021-08-03 DIAGNOSIS — I5022 Chronic systolic (congestive) heart failure: Secondary | ICD-10-CM

## 2021-08-07 ENCOUNTER — Telehealth: Payer: Self-pay

## 2021-08-07 NOTE — Telephone Encounter (Signed)
Remote ICM transmission received.  Attempted call to patient regarding ICM remote transmission and left detailed message per DPR.  Advised to return call for any fluid symptoms or questions. Next ICM remote transmission scheduled 09/09/2021.

## 2021-08-07 NOTE — Progress Notes (Signed)
EPIC Encounter for ICM Monitoring  Patient Name: Kathleen Edwards is a 76 y.o. female Date: 08/07/2021 Primary Care Physican: Glenda Chroman, MD Primary Cardiologist: Branch Electrophysiologist: Allred Bi-V Pacing: 99% 05/29/2021 Weight: 149 lbs (baseline 147-148)   AT/AF Burden: <1% (taking Eliquis)                                                            Attempted call to patient and unable to reach.  Left detailed message per DPR regarding transmission. Transmission reviewed.     CorVue thoracic impedance normal but was suggesting possible fluid accumulation from 12/27-1/3.   Prescribed: Furosemide 20 mg take 1 tablet daily. Potassium 10 mEq take 1 tablet daily   Labs: 12/24/2020 Creatinine 0.73, BUN 21, Potassium 4.0, Sodium 142, GFR 86 A complete set of results can be found in Results Review.   Recommendations:  Left voice mail with ICM number and encouraged to call if experiencing any fluid symptoms.   Follow-up plan: ICM clinic phone appointment on 09/09/2021.   91 day device clinic remote transmission 09/08/2021.    EP/Cardiology Office Visits: 09/17/2021 with Dr Harl Bowie.     Copy of ICM check sent to Dr. Rayann Heman.   3 month ICM trend: 08/03/2021.    12-14 Month ICM trend:     Rosalene Billings, RN 08/07/2021 11:09 AM

## 2021-08-18 ENCOUNTER — Other Ambulatory Visit: Payer: Self-pay | Admitting: Cardiology

## 2021-08-18 NOTE — Telephone Encounter (Signed)
Prescription refill request for Eliquis received. Indication: PAF Last office visit: 05/29/21  Lenna Sciara Allred MD Scr: 0.73 on 12/24/20 Age: 76 Weight: 67.7  Based on above findings Eliquis 5mg  twice daily is the appropriate dose.  Refill approved. Requested repeat labs at upcoming appt with Dr Harl Bowie on 09/17/21.

## 2021-09-08 ENCOUNTER — Ambulatory Visit (INDEPENDENT_AMBULATORY_CARE_PROVIDER_SITE_OTHER): Payer: Medicare Other

## 2021-09-08 DIAGNOSIS — I5022 Chronic systolic (congestive) heart failure: Secondary | ICD-10-CM

## 2021-09-08 LAB — CUP PACEART REMOTE DEVICE CHECK
Battery Remaining Longevity: 49 mo
Battery Remaining Percentage: 61 %
Battery Voltage: 2.96 V
Brady Statistic AP VP Percent: 22 %
Brady Statistic AP VS Percent: 1 %
Brady Statistic AS VP Percent: 78 %
Brady Statistic AS VS Percent: 1 %
Brady Statistic RA Percent Paced: 21 %
Date Time Interrogation Session: 20230214020017
HighPow Impedance: 74 Ohm
HighPow Impedance: 74 Ohm
Implantable Lead Implant Date: 20200218
Implantable Lead Implant Date: 20200218
Implantable Lead Implant Date: 20200218
Implantable Lead Location: 753858
Implantable Lead Location: 753859
Implantable Lead Location: 753860
Implantable Pulse Generator Implant Date: 20200218
Lead Channel Impedance Value: 1100 Ohm
Lead Channel Impedance Value: 400 Ohm
Lead Channel Impedance Value: 530 Ohm
Lead Channel Pacing Threshold Amplitude: 0.625 V
Lead Channel Pacing Threshold Amplitude: 1 V
Lead Channel Pacing Threshold Amplitude: 2.625 V
Lead Channel Pacing Threshold Pulse Width: 0.4 ms
Lead Channel Pacing Threshold Pulse Width: 0.5 ms
Lead Channel Pacing Threshold Pulse Width: 0.7 ms
Lead Channel Sensing Intrinsic Amplitude: 0.7 mV
Lead Channel Sensing Intrinsic Amplitude: 6.9 mV
Lead Channel Setting Pacing Amplitude: 2 V
Lead Channel Setting Pacing Amplitude: 2 V
Lead Channel Setting Pacing Amplitude: 3.125
Lead Channel Setting Pacing Pulse Width: 0.4 ms
Lead Channel Setting Pacing Pulse Width: 0.7 ms
Lead Channel Setting Sensing Sensitivity: 0.5 mV
Pulse Gen Serial Number: 9880584

## 2021-09-09 ENCOUNTER — Ambulatory Visit (INDEPENDENT_AMBULATORY_CARE_PROVIDER_SITE_OTHER): Payer: Medicare Other

## 2021-09-09 DIAGNOSIS — I5022 Chronic systolic (congestive) heart failure: Secondary | ICD-10-CM

## 2021-09-09 DIAGNOSIS — Z95810 Presence of automatic (implantable) cardiac defibrillator: Secondary | ICD-10-CM | POA: Diagnosis not present

## 2021-09-09 NOTE — Progress Notes (Signed)
EPIC Encounter for ICM Monitoring  Patient Name: KRISTEN FROMM is a 76 y.o. female Date: 09/09/2021 Primary Care Physican: Glenda Chroman, MD Primary Cardiologist: Branch Electrophysiologist: Allred Bi-V Pacing: >99% 05/29/2021 Weight: 149 lbs (baseline 147-148)   AT/AF Burden: <1% (taking Eliquis)                                                            Transmission reviewed.     CorVue thoracic impedance normal but was suggesting possible fluid accumulation from 1/25-2/1.   Prescribed: Furosemide 20 mg take 1 tablet daily. Potassium 10 mEq take 1 tablet daily   Labs: 12/24/2020 Creatinine 0.73, BUN 21, Potassium 4.0, Sodium 142, GFR 86 A complete set of results can be found in Results Review.   Recommendations:  No changes.   Follow-up plan: ICM clinic phone appointment on 10/12/2021.   91 day device clinic remote transmission 12/08/2021.    EP/Cardiology Office Visits: 09/17/2021 with Dr Harl Bowie.     Copy of ICM check sent to Dr. Rayann Heman.   3 month ICM trend: 09/07/2021.    12-14 Month ICM trend:     Rosalene Billings, RN 09/09/2021 1:14 PM

## 2021-09-11 NOTE — Progress Notes (Signed)
Remote ICD transmission.   

## 2021-09-16 ENCOUNTER — Other Ambulatory Visit: Payer: Self-pay | Admitting: Cardiology

## 2021-09-17 ENCOUNTER — Other Ambulatory Visit: Payer: Self-pay

## 2021-09-17 ENCOUNTER — Encounter: Payer: Self-pay | Admitting: *Deleted

## 2021-09-17 ENCOUNTER — Ambulatory Visit: Payer: Medicare Other | Admitting: Cardiology

## 2021-09-17 ENCOUNTER — Encounter: Payer: Self-pay | Admitting: Cardiology

## 2021-09-17 VITALS — BP 124/80 | HR 62 | Ht 63.0 in | Wt 144.6 lb

## 2021-09-17 DIAGNOSIS — E782 Mixed hyperlipidemia: Secondary | ICD-10-CM

## 2021-09-17 DIAGNOSIS — I5022 Chronic systolic (congestive) heart failure: Secondary | ICD-10-CM

## 2021-09-17 DIAGNOSIS — I2510 Atherosclerotic heart disease of native coronary artery without angina pectoris: Secondary | ICD-10-CM

## 2021-09-17 DIAGNOSIS — I480 Paroxysmal atrial fibrillation: Secondary | ICD-10-CM | POA: Diagnosis not present

## 2021-09-17 NOTE — Patient Instructions (Addendum)
Medication Instructions:  Your physician recommends that you continue on your current medications as directed. Please refer to the Current Medication list given to you today.   Labwork: Your physician recommends that you return for lab work in:  BMET, Lipid, CBC to be done at [place]  Testing/Procedures: none  Follow-Up: Your physician recommends that you schedule a follow-up appointment in: 6 months  Any Other Special Instructions Will Be Listed Below (If Applicable).  If you need a refill on your cardiac medications before your next appointment, please call your pharmacy.

## 2021-09-17 NOTE — Progress Notes (Signed)
Clinical Summary Kathleen Edwards is a 76 y.o.femaleseen today for follow up fo the following medical problems.    1. Chronic systolic HF now with normalized LVEF - admitted 01/17/18 with new diagnosis of systolic HF.  - from notes presented with hypertensive emergency, started on nitro gtt and diuresed - EKG LBBB,  - 12/2017 echo [facility]: LVEF 20%  - 12/2017 cath with mild to moderate CAD. CI 3.2, PCWP 10, LVEDP 14. Overall normal CO and filling pressures   Jan 2020 echo: LVEF 20-25%.    08/2018 BiV AICD placed, followed by EP - normal device check 11/2020   03/2019 echo: LVEF 55-60%,      - no SOB/DOE, no LE edema - compliant with meds - 08/2021 normal device check     2. PAF -  noted device checks 11/2018 and 02/2019 - CHADS2Vasc score is 5 (CHF, HTN, age x1, CAD,gender)   No recent palitations - no bleeding on eliquis   3. CAD - 12/2017 cath as reported below, overall mild to moderate disease - no chest pain  4. 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   Previously retired at [employer].      Past Medical History:  Diagnosis Date   Acquired absence of both cervix and uterus    Anxiety disorder    Asthma    Degenerative joint disease of hand    Diverticulosis    Fatigue    H/O: osteoarthritis    History of vitamin A deficiency    Hyperlipidemia    Hypertension    Keratosis, seborrheic    Lipoma    Ovarian failure    Rhinitis, allergic    Vitamin D deficiency      Allergies  Allergen Reactions   Lisinopril Cough   Vioxx [Rofecoxib] Other (See Comments)    Epigastric discomfort      Current Outpatient Medications  Medication Sig Dispense Refill   Cholecalciferol (VITAMIN D PO) Take 1 capsule by mouth daily.     ELIQUIS 5 MG TABS tablet TAKE 1 TABLET BY MOUTH TWICE DAILY 60 tablet 5   ENTRESTO 97-103 MG TAKE 1 TABLET BY MOUTH TWICE DAILY 60 tablet 3    furosemide (LASIX) 20 MG tablet Take 1 tablet (20 mg total) by mouth daily. 90 tablet 3   metoprolol succinate (TOPROL-XL) 25 MG 24 hr tablet TAKE 1 AND 1/2 TABLETS BY MOUTH DAILY 135 tablet 3   potassium chloride (K-DUR) 10 MEQ tablet Take 10 mEq by mouth daily.     pravastatin (PRAVACHOL) 40 MG tablet Take 40 mg by mouth daily.     No current facility-administered medications for this visit.     Past Surgical History:  Procedure Laterality Date   BIV ICD INSERTION CRT-D N/A 09/12/2018   Procedure: BIV ICD INSERTION CRT-D;  Surgeon: Thompson Grayer, MD;  Location: [place] [facility];  Service: Cardiovascular;  Laterality: N/A;   RIGHT/LEFT HEART CATH AND CORONARY ANGIOGRAPHY N/A 02/15/2018   Procedure: RIGHT/LEFT HEART CATH AND CORONARY ANGIOGRAPHY;  Surgeon: Troy Sine, MD;  Location: [place] [facility];  Service: Cardiovascular;  Laterality: N/A;   TOTAL ABDOMINAL HYSTERECTOMY  1979     Allergies  Allergen Reactions   Lisinopril Cough   Vioxx [Rofecoxib] Other (See Comments)    Epigastric discomfort       Family History  Problem Relation  Age of Onset   CVA Mother    Brain cancer Father      Social History Kathleen Edwards reports that she quit smoking about 23 years ago. Her smoking use included cigarettes. She has never used smokeless tobacco. Kathleen Edwards reports that she does not currently use alcohol.   Review of Systems CONSTITUTIONAL: No weight loss, fever, chills, weakness or fatigue.  HEENT: Eyes: No visual loss, blurred vision, double vision or yellow sclerae.No hearing loss, sneezing, congestion, runny nose or sore throat.  SKIN: No rash or itching.  CARDIOVASCULAR: per hpi RESPIRATORY: No shortness of breath, cough or sputum.  GASTROINTESTINAL: No anorexia, nausea, vomiting or diarrhea. No abdominal pain or blood.  GENITOURINARY: No burning on urination, no polyuria NEUROLOGICAL: No headache, dizziness, syncope, paralysis, ataxia, numbness or tingling in the  extremities. No change in bowel or bladder control.  MUSCULOSKELETAL: No muscle, back pain, joint pain or stiffness.  LYMPHATICS: No enlarged nodes. No history of splenectomy.  PSYCHIATRIC: No history of depression or anxiety.  ENDOCRINOLOGIC: No reports of sweating, cold or heat intolerance. No polyuria or polydipsia.  Marland Kitchen   Physical Examination Today's Vitals   09/17/21 0811  BP: 124/80  Pulse: 62  SpO2: 98%  Weight: 144 lb 9.6 oz (65.6 kg)  Height: 5\' 3"  (1.6 m)   Body mass index is 25.61 kg/m.  Gen: resting comfortably, no acute distress HEENT: no scleral icterus, pupils equal round and reactive, no palptable cervical adenopathy,  CV: RRR, no m/r,g no jvd Resp: Clear to auscultation bilaterally GI: abdomen is soft, non-tender, non-distended, normal bowel sounds, no hepatosplenomegaly MSK: extremities are warm, no edema.  Skin: warm, no rash Neuro:  no focal deficits Psych: appropriate affect   Diagnostic Studies  12/2017 cath Dist RCA lesion is 55% stenosed. Mid LM to Dist LM lesion is 20% stenosed. Ost Cx lesion is 20% stenosed. Prox LAD lesion is 30% stenosed. Prox RCA lesion is 20% stenosed. Mid RCA lesion is 30% stenosed.   There is evidence for diffuse coronary calcification involving all coronary arteries.   Left main stenosis with distal 20% narrowing; proximal irregularity of the LAD with 30% proximal stenosis; 20% ostial stenosis of the left circumflex vessel; and very large dominant RCA with mild to moderate calcification and irregularity with 20% proximal 30% mid and 50 to 60% smooth stenosis immediately proximal to the takeoff of a large PDA and PLA vessel with 20% PLA narrowing.   RECOMMENDATION: Guideline directed medical therapy for the patient's cardiomyopathy which most likely is of nonischemic etiology.     Jan 2020 echo Study Conclusions   - Procedure narrative: Transthoracic echocardiography. Image   quality was fair. The study was technically  difficult, as a   result of poor acoustic windows and body habitus. - Left ventricle: The cavity size was normal. Wall thickness was   normal. Systolic function was severely reduced. The estimated   ejection fraction was in the range of 20% to 25%. Diffuse   hypokinesis. Doppler parameters are consistent with abnormal left   ventricular relaxation (grade 1 diastolic dysfunction). - Ventricular septum: Septal motion showed abnormal function and   dyssynergy. These changes are consistent with a left bundle   Telly Broberg block. - Mitral valve: Mildly to moderately calcified annulus. There was   mild regurgitation. - Left atrium: The atrium was mildly dilated. - Atrial septum: No defect or patent foramen ovale was identified.     Assessment and Plan   1 Chronic systolic HF - LVEF  has normalized - no recent symptoms, continue current meds   2. PAF/acquired thrombophilia - doing well without symptoms, continue current meds incluing eliquis for stroke prevention.    3. CAD -no recent symptoms, continue current meds  4. Hyperlipidemia - request labs from pcp     Arnoldo Lenis, M.D

## 2021-09-17 NOTE — Addendum Note (Signed)
Addended by: Sung Amabile on: 09/17/2021 08:38 AM   Modules accepted: Orders

## 2021-10-01 DIAGNOSIS — I10 Essential (primary) hypertension: Secondary | ICD-10-CM | POA: Diagnosis not present

## 2021-10-01 DIAGNOSIS — I5020 Unspecified systolic (congestive) heart failure: Secondary | ICD-10-CM | POA: Diagnosis not present

## 2021-10-01 DIAGNOSIS — Z0000 Encounter for general adult medical examination without abnormal findings: Secondary | ICD-10-CM | POA: Diagnosis not present

## 2021-10-01 DIAGNOSIS — Z789 Other specified health status: Secondary | ICD-10-CM | POA: Diagnosis not present

## 2021-10-01 DIAGNOSIS — Z299 Encounter for prophylactic measures, unspecified: Secondary | ICD-10-CM | POA: Diagnosis not present

## 2021-10-01 DIAGNOSIS — I429 Cardiomyopathy, unspecified: Secondary | ICD-10-CM | POA: Diagnosis not present

## 2021-10-01 DIAGNOSIS — Z7189 Other specified counseling: Secondary | ICD-10-CM | POA: Diagnosis not present

## 2021-10-05 ENCOUNTER — Telehealth: Payer: Self-pay | Admitting: *Deleted

## 2021-10-05 MED ORDER — PRAVASTATIN SODIUM 80 MG PO TABS: 80.0000 mg | ORAL_TABLET | Freq: Every day | ORAL | 3 refills | Status: DC

## 2021-10-05 NOTE — Telephone Encounter (Signed)
Laurine Blazer, LPN  ?01/29/6150 83:43 AM EDT Back to Top  ?  ?Notified, copy to pcp.   ? ?New '80mg'$  tab of Pravastatin sent to [facility] now.   ?

## 2021-10-05 NOTE — Telephone Encounter (Signed)
-----   Message from Merlene Laughter, RN sent at 09/22/2021  1:43 PM EST ----- ? ?----- Message ----- ?From: Arnoldo Lenis, MD ?Sent: 09/20/2021  10:50 AM EST ?To: Merlene Laughter, RN ? ?Cholesterol mildly elevated given that she mad mild to moderate plaque in her arteries from her 2019 heart cath. Can we increase her pravastatin to '80mg'$  daily please ? ? ?J BrancH MD ? ?

## 2021-10-12 ENCOUNTER — Ambulatory Visit (INDEPENDENT_AMBULATORY_CARE_PROVIDER_SITE_OTHER): Payer: Medicare Other

## 2021-10-12 DIAGNOSIS — Z95810 Presence of automatic (implantable) cardiac defibrillator: Secondary | ICD-10-CM

## 2021-10-12 DIAGNOSIS — I5022 Chronic systolic (congestive) heart failure: Secondary | ICD-10-CM | POA: Diagnosis not present

## 2021-10-16 NOTE — Progress Notes (Signed)
EPIC Encounter for ICM Monitoring ? ?Patient Name: Kathleen Edwards is a 76 y.o. female ?Date: 10/16/2021 ?Primary Care Physican: Glenda Chroman, MD ?Primary Cardiologist: Harl Bowie ?Electrophysiologist: Allred ?Bi-V Pacing: >99% ?05/29/2021 Weight: 149 lbs (baseline 147-148) ?  ?AT/AF Burden: <1% (taking Eliquis) ?                                                         ?  ?Transmission reviewed.  ?   ?CorVue thoracic impedance normal but was suggesting possible fluid accumulation from 3/12-3/16. ?  ?Prescribed: ?Furosemide 20 mg take 1 tablet daily. ?Potassium 10 mEq take 1 tablet daily ?  ?Labs: ?09/17/2021 Creatinine 0.74, BUN 19, Potassium 3.9, Sodium 140, GFR 84 ?12/24/2020 Creatinine 0.73, BUN 21, Potassium 4.0, Sodium 142, GFR 86 ?A complete set of results can be found in Results Review. ?  ?Recommendations:  No changes. ?  ?Follow-up plan: ICM clinic phone appointment on 11/16/2021.   91 day device clinic remote transmission 12/08/2021.  ?  ?EP/Cardiology Office Visits: 03/25/2022 with Dr Harl Bowie.  05/28/2022 with Dr Rayann Heman. ?  ?Copy of ICM check sent to Dr. Rayann Heman.  ? ?3 month ICM trend: 10/12/2021. ? ? ? ?12-14 Month ICM trend:  ? ? ? ?Rosalene Billings, RN ?10/16/2021 ?4:13 PM ? ?

## 2021-11-16 ENCOUNTER — Ambulatory Visit (INDEPENDENT_AMBULATORY_CARE_PROVIDER_SITE_OTHER): Payer: Medicare Other

## 2021-11-16 DIAGNOSIS — I5022 Chronic systolic (congestive) heart failure: Secondary | ICD-10-CM | POA: Diagnosis not present

## 2021-11-16 DIAGNOSIS — Z95810 Presence of automatic (implantable) cardiac defibrillator: Secondary | ICD-10-CM | POA: Diagnosis not present

## 2021-11-19 ENCOUNTER — Telehealth: Payer: Self-pay

## 2021-11-19 NOTE — Progress Notes (Signed)
EPIC Encounter for ICM Monitoring ? ?Patient Name: Kathleen Edwards is a 76 y.o. female ?Date: 11/19/2021 ?Primary Care Physican: Glenda Chroman, MD ?Primary Cardiologist: Harl Bowie ?Electrophysiologist: Allred ?Bi-V Pacing: >99% ?09/17/2021 Office Weight: 144 lbs  ?  ?AT/AF Burden: <1% (taking Eliquis) ?                                                         ?  ?Attempted call to patient and unable to reach.  Transmission reviewed.  ?   ?CorVue thoracic impedance normal but was suggesting possible fluid accumulation from 3/12-3/16. ?  ?Prescribed: ?Furosemide 20 mg take 1 tablet daily. ?Potassium 10 mEq take 1 tablet daily ?  ?Labs: ?09/17/2021 Creatinine 0.74, BUN 19, Potassium 3.9, Sodium 140, GFR 84 ?12/24/2020 Creatinine 0.73, BUN 21, Potassium 4.0, Sodium 142, GFR 86 ?A complete set of results can be found in Results Review. ?  ?Recommendations:  Unable to reach.   ?  ?Follow-up plan: ICM clinic phone appointment on 12/22/2021.   91 day device clinic remote transmission 12/08/2021.  ?  ?EP/Cardiology Office Visits: 03/25/2022 with Dr Harl Bowie.  05/28/2022 with Dr Rayann Heman. ?  ?Copy of ICM check sent to Dr. Rayann Heman.  ? ?3 month ICM trend: 11/16/2021. ? ? ? ?12-14 Month ICM trend:  ? ? ? ?Rosalene Billings, RN ?11/19/2021 ?3:05 PM ? ?

## 2021-11-19 NOTE — Telephone Encounter (Signed)
Remote ICM transmission received.  Attempted call to patient regarding ICM remote transmission and no answer or voice mail option.  

## 2021-12-08 ENCOUNTER — Ambulatory Visit (INDEPENDENT_AMBULATORY_CARE_PROVIDER_SITE_OTHER): Payer: Medicare Other

## 2021-12-08 DIAGNOSIS — I5022 Chronic systolic (congestive) heart failure: Secondary | ICD-10-CM | POA: Diagnosis not present

## 2021-12-08 LAB — CUP PACEART REMOTE DEVICE CHECK
Battery Remaining Longevity: 47 mo
Battery Remaining Percentage: 59 %
Battery Voltage: 2.96 V
Brady Statistic AP VP Percent: 20 %
Brady Statistic AP VS Percent: 1 %
Brady Statistic AS VP Percent: 80 %
Brady Statistic AS VS Percent: 1 %
Brady Statistic RA Percent Paced: 19 %
Date Time Interrogation Session: 20230516020016
HighPow Impedance: 65 Ohm
HighPow Impedance: 65 Ohm
Implantable Lead Implant Date: 20200218
Implantable Lead Implant Date: 20200218
Implantable Lead Implant Date: 20200218
Implantable Lead Location: 753858
Implantable Lead Location: 753859
Implantable Lead Location: 753860
Implantable Pulse Generator Implant Date: 20200218
Lead Channel Impedance Value: 1100 Ohm
Lead Channel Impedance Value: 410 Ohm
Lead Channel Impedance Value: 510 Ohm
Lead Channel Pacing Threshold Amplitude: 0.625 V
Lead Channel Pacing Threshold Amplitude: 1 V
Lead Channel Pacing Threshold Amplitude: 2.625 V
Lead Channel Pacing Threshold Pulse Width: 0.4 ms
Lead Channel Pacing Threshold Pulse Width: 0.5 ms
Lead Channel Pacing Threshold Pulse Width: 0.7 ms
Lead Channel Sensing Intrinsic Amplitude: 1.1 mV
Lead Channel Sensing Intrinsic Amplitude: 10.1 mV
Lead Channel Setting Pacing Amplitude: 2 V
Lead Channel Setting Pacing Amplitude: 2 V
Lead Channel Setting Pacing Amplitude: 3.125
Lead Channel Setting Pacing Pulse Width: 0.4 ms
Lead Channel Setting Pacing Pulse Width: 0.7 ms
Lead Channel Setting Sensing Sensitivity: 0.5 mV
Pulse Gen Serial Number: 9880584

## 2021-12-22 ENCOUNTER — Ambulatory Visit (INDEPENDENT_AMBULATORY_CARE_PROVIDER_SITE_OTHER): Payer: Medicare Other

## 2021-12-22 DIAGNOSIS — Z95810 Presence of automatic (implantable) cardiac defibrillator: Secondary | ICD-10-CM

## 2021-12-22 DIAGNOSIS — I5022 Chronic systolic (congestive) heart failure: Secondary | ICD-10-CM

## 2021-12-24 NOTE — Progress Notes (Signed)
Remote ICD transmission.   

## 2021-12-25 NOTE — Progress Notes (Signed)
EPIC Encounter for ICM Monitoring  Patient Name: Kathleen Edwards is a 76 y.o. female Date: 12/25/2021 Primary Care Physican: Glenda Chroman, MD Primary Cardiologist: Branch Electrophysiologist: Allred Bi-V Pacing: >99% 09/17/2021 Office Weight: 144 lbs    AT/AF Burden: <1% (taking Eliquis)                                                            Attempted call to patient and unable to reach.  Transmission reviewed.     CorVue thoracic impedance suggesting possible fluid accumulation starting 5/29.   Prescribed: Furosemide 20 mg take 1 tablet daily. Potassium 10 mEq take 1 tablet daily   Labs: 09/17/2021 Creatinine 0.74, BUN 19, Potassium 3.9, Sodium 140, GFR 84 12/24/2020 Creatinine 0.73, BUN 21, Potassium 4.0, Sodium 142, GFR 86 A complete set of results can be found in Results Review.   Recommendations:  Unable to reach.     Follow-up plan: ICM clinic phone appointment on 12/29/2021 to recheck fluid levels.   91 day device clinic remote transmission 03/09/2022.    EP/Cardiology Office Visits: 03/25/2022 with Dr Harl Bowie.  05/28/2022 with Dr Rayann Heman.   Copy of ICM check sent to Dr. Rayann Heman.  Will send to Dr Harl Bowie for review if patient is reached.   Direct trend view through 12/24/2021.    3 month ICM trend: 12/22/2021.    12-14 Month ICM trend:     Rosalene Billings, RN 12/25/2021 2:32 PM

## 2021-12-29 ENCOUNTER — Ambulatory Visit (INDEPENDENT_AMBULATORY_CARE_PROVIDER_SITE_OTHER): Payer: Medicare Other

## 2021-12-29 DIAGNOSIS — Z95810 Presence of automatic (implantable) cardiac defibrillator: Secondary | ICD-10-CM

## 2021-12-29 DIAGNOSIS — I5022 Chronic systolic (congestive) heart failure: Secondary | ICD-10-CM

## 2021-12-30 NOTE — Progress Notes (Signed)
EPIC Encounter for ICM Monitoring  Patient Name: Kathleen Edwards is a 76 y.o. female Date: 12/30/2021 Primary Care Physican: Glenda Chroman, MD Primary Cardiologist: Branch Electrophysiologist: Allred Bi-V Pacing: >99% 09/17/2021 Office Weight: 144 lbs    AT/AF Burden: <1% (taking Eliquis)                                                            Transmission reviewed.     CorVue thoracic impedance suggesting fluid levels returned to normal.   Prescribed: Furosemide 20 mg take 1 tablet daily. Potassium 10 mEq take 1 tablet daily   Labs: 09/17/2021 Creatinine 0.74, BUN 19, Potassium 3.9, Sodium 140, GFR 84 12/24/2020 Creatinine 0.73, BUN 21, Potassium 4.0, Sodium 142, GFR 86 A complete set of results can be found in Results Review.   Recommendations:  No Changes   Follow-up plan: ICM clinic phone appointment on 02/01/2022.   91 day device clinic remote transmission 03/09/2022.    EP/Cardiology Office Visits: 03/25/2022 with Dr Harl Bowie.  05/28/2022 with Dr Rayann Heman.   Copy of ICM check sent to Dr. Rayann Heman.   3 month ICM trend: 12/29/2021.    12-14 Month ICM trend:     Rosalene Billings, RN 12/30/2021 10:50 AM

## 2022-01-06 DIAGNOSIS — Z0000 Encounter for general adult medical examination without abnormal findings: Secondary | ICD-10-CM | POA: Diagnosis not present

## 2022-01-06 DIAGNOSIS — M8580 Other specified disorders of bone density and structure, unspecified site: Secondary | ICD-10-CM | POA: Diagnosis not present

## 2022-01-06 DIAGNOSIS — Z299 Encounter for prophylactic measures, unspecified: Secondary | ICD-10-CM | POA: Diagnosis not present

## 2022-01-06 DIAGNOSIS — R5383 Other fatigue: Secondary | ICD-10-CM | POA: Diagnosis not present

## 2022-01-06 DIAGNOSIS — Z79899 Other long term (current) drug therapy: Secondary | ICD-10-CM | POA: Diagnosis not present

## 2022-01-06 DIAGNOSIS — E7800 Pure hypercholesterolemia, unspecified: Secondary | ICD-10-CM | POA: Diagnosis not present

## 2022-01-06 DIAGNOSIS — Z789 Other specified health status: Secondary | ICD-10-CM | POA: Diagnosis not present

## 2022-01-06 DIAGNOSIS — I10 Essential (primary) hypertension: Secondary | ICD-10-CM | POA: Diagnosis not present

## 2022-01-18 ENCOUNTER — Other Ambulatory Visit: Payer: Self-pay | Admitting: Cardiology

## 2022-02-01 ENCOUNTER — Ambulatory Visit (INDEPENDENT_AMBULATORY_CARE_PROVIDER_SITE_OTHER): Payer: Medicare Other

## 2022-02-01 DIAGNOSIS — I5022 Chronic systolic (congestive) heart failure: Secondary | ICD-10-CM | POA: Diagnosis not present

## 2022-02-01 DIAGNOSIS — Z95810 Presence of automatic (implantable) cardiac defibrillator: Secondary | ICD-10-CM

## 2022-02-04 NOTE — Progress Notes (Signed)
EPIC Encounter for ICM Monitoring  Patient Name: Kathleen Edwards is a 77 y.o. female Date: 02/04/2022 Primary Care Physican: Glenda Chroman, MD Primary Cardiologist: Branch Electrophysiologist: Allred Bi-V Pacing: >99% 7/13//2023 Weight: 144 lbs    AT/AF Burden: <1% (taking Eliquis)                                                            Spoke with patient and heart failure questions reviewed.  Pt asymptomatic for fluid accumulation.  Reports feeling well at this time and voices no complaints.   She may have eaten foods high in salt during decreased impedance but no symptoms during that time.     CorVue thoracic impedance suggesting fluid levels returned to normal.   Prescribed: Furosemide 20 mg take 1 tablet daily. Potassium 10 mEq take 1 tablet daily   Labs: 09/17/2021 Creatinine 0.74, BUN 19, Potassium 3.9, Sodium 140, GFR 84 12/24/2020 Creatinine 0.73, BUN 21, Potassium 4.0, Sodium 142, GFR 86 A complete set of results can be found in Results Review.   Recommendations:  No changes and encouraged to call if experiencing any fluid symptoms.   Follow-up plan: ICM clinic phone appointment on 03/10/2022.   91 day device clinic remote transmission 03/09/2022.    EP/Cardiology Office Visits: 03/25/2022 with Dr Harl Bowie.  05/28/2022 with Dr Rayann Heman.   Copy of ICM check sent to Dr. Rayann Heman.   3 month ICM trend: 02/01/2022.    12-14 Month ICM trend:     Rosalene Billings, RN 02/04/2022 1:29 PM

## 2022-02-16 ENCOUNTER — Other Ambulatory Visit: Payer: Self-pay | Admitting: Cardiology

## 2022-02-16 NOTE — Telephone Encounter (Signed)
Prescription refill request for Eliquis received. Indication: PAF Last office visit: 09/17/21  Zandra Abts MD Scr: 0.74 on 09/17/21 Age: 76 Weight: 65.6kg  Based on above findings Eliquis '5mg'$  twice daily is the appropriate dose.  Refill approved.

## 2022-03-09 ENCOUNTER — Ambulatory Visit (INDEPENDENT_AMBULATORY_CARE_PROVIDER_SITE_OTHER): Payer: Medicare Other

## 2022-03-09 DIAGNOSIS — Z95810 Presence of automatic (implantable) cardiac defibrillator: Secondary | ICD-10-CM | POA: Diagnosis not present

## 2022-03-09 LAB — CUP PACEART REMOTE DEVICE CHECK
Battery Remaining Longevity: 44 mo
Battery Remaining Percentage: 56 %
Battery Voltage: 2.96 V
Brady Statistic AP VP Percent: 19 %
Brady Statistic AP VS Percent: 1 %
Brady Statistic AS VP Percent: 81 %
Brady Statistic AS VS Percent: 1 %
Brady Statistic RA Percent Paced: 19 %
Date Time Interrogation Session: 20230815020016
HighPow Impedance: 70 Ohm
HighPow Impedance: 70 Ohm
Implantable Lead Implant Date: 20200218
Implantable Lead Implant Date: 20200218
Implantable Lead Implant Date: 20200218
Implantable Lead Location: 753858
Implantable Lead Location: 753859
Implantable Lead Location: 753860
Implantable Pulse Generator Implant Date: 20200218
Lead Channel Impedance Value: 1075 Ohm
Lead Channel Impedance Value: 400 Ohm
Lead Channel Impedance Value: 530 Ohm
Lead Channel Pacing Threshold Amplitude: 0.625 V
Lead Channel Pacing Threshold Amplitude: 1.125 V
Lead Channel Pacing Threshold Amplitude: 2.625 V
Lead Channel Pacing Threshold Pulse Width: 0.4 ms
Lead Channel Pacing Threshold Pulse Width: 0.5 ms
Lead Channel Pacing Threshold Pulse Width: 0.7 ms
Lead Channel Sensing Intrinsic Amplitude: 1.2 mV
Lead Channel Sensing Intrinsic Amplitude: 8.5 mV
Lead Channel Setting Pacing Amplitude: 2 V
Lead Channel Setting Pacing Amplitude: 2.125
Lead Channel Setting Pacing Amplitude: 3.125
Lead Channel Setting Pacing Pulse Width: 0.4 ms
Lead Channel Setting Pacing Pulse Width: 0.7 ms
Lead Channel Setting Sensing Sensitivity: 0.5 mV
Pulse Gen Serial Number: 9880584

## 2022-03-11 ENCOUNTER — Ambulatory Visit (INDEPENDENT_AMBULATORY_CARE_PROVIDER_SITE_OTHER): Payer: Medicare Other

## 2022-03-11 DIAGNOSIS — I5022 Chronic systolic (congestive) heart failure: Secondary | ICD-10-CM | POA: Diagnosis not present

## 2022-03-11 DIAGNOSIS — Z95810 Presence of automatic (implantable) cardiac defibrillator: Secondary | ICD-10-CM

## 2022-03-11 NOTE — Progress Notes (Signed)
EPIC Encounter for ICM Monitoring  Patient Name: Kathleen Edwards is a 76 y.o. female Date: 03/11/2022 Primary Care Physican: Glenda Chroman, MD Primary Cardiologist: Branch Electrophysiologist: Allred Bi-V Pacing: >99% 7/13//2023 Weight: 144 lbs    AT/AF Burden: <1% (taking Eliquis)                                                            Transmission reviewed.    CorVue thoracic impedance suggesting normal fluid levels.   Prescribed: Furosemide 20 mg take 1 tablet daily. Potassium 10 mEq take 1 tablet daily   Labs: 09/17/2021 Creatinine 0.74, BUN 19, Potassium 3.9, Sodium 140, GFR 84 12/24/2020 Creatinine 0.73, BUN 21, Potassium 4.0, Sodium 142, GFR 86 A complete set of results can be found in Results Review.   Recommendations:  No changes.   Follow-up plan: ICM clinic phone appointment on 04/12/2022.   91 day device clinic remote transmission 06/08/2022.    EP/Cardiology Office Visits: 03/25/2022 with Dr Harl Bowie.  05/28/2022 with Dr Rayann Heman.   Copy of ICM check sent to Dr. Rayann Heman.    3 month ICM trend: 03/10/2022.    12-14 Month ICM trend:     Rosalene Billings, RN 03/11/2022 4:36 PM

## 2022-03-17 ENCOUNTER — Other Ambulatory Visit: Payer: Self-pay | Admitting: Cardiology

## 2022-03-25 ENCOUNTER — Ambulatory Visit: Payer: Medicare Other | Attending: Cardiology | Admitting: Cardiology

## 2022-03-25 ENCOUNTER — Encounter: Payer: Self-pay | Admitting: *Deleted

## 2022-03-25 ENCOUNTER — Encounter: Payer: Self-pay | Admitting: Cardiology

## 2022-03-25 ENCOUNTER — Telehealth: Payer: Self-pay | Admitting: Cardiology

## 2022-03-25 VITALS — BP 136/70 | HR 60 | Ht 63.0 in | Wt 146.4 lb

## 2022-03-25 DIAGNOSIS — I5022 Chronic systolic (congestive) heart failure: Secondary | ICD-10-CM

## 2022-03-25 DIAGNOSIS — R079 Chest pain, unspecified: Secondary | ICD-10-CM | POA: Diagnosis not present

## 2022-03-25 DIAGNOSIS — D6869 Other thrombophilia: Secondary | ICD-10-CM

## 2022-03-25 DIAGNOSIS — E782 Mixed hyperlipidemia: Secondary | ICD-10-CM

## 2022-03-25 DIAGNOSIS — I480 Paroxysmal atrial fibrillation: Secondary | ICD-10-CM

## 2022-03-25 NOTE — Patient Instructions (Signed)
Medication Instructions:  Continue all current medications.  Labwork: none  Testing/Procedures: Your physician has requested that you have a lexiscan myoview. For further information please visit [email or web address]. Please follow instruction sheet, as given.  Office will contact with results via phone, letter or mychart.     Follow-Up: 6 months   Any Other Special Instructions Will Be Listed Below (If Applicable).   If you need a refill on your cardiac medications before your next appointment, please call your pharmacy.

## 2022-03-25 NOTE — Progress Notes (Signed)
Clinical Summary Kathleen Edwards is a 76 y.o.femaleseen today for follow up fo the following medical problems.    1. Chronic systolic HF now with normalized LVEF - admitted 01/17/18 with new diagnosis of systolic HF.  - from notes presented with hypertensive emergency, started on nitro gtt and diuresed - EKG LBBB,  - 12/2017 echo [facility]: LVEF 20%  - 12/2017 cath with mild to moderate CAD. CI 3.2, PCWP 10, LVEDP 14. Overall normal CO and filling pressures   Jan 2020 echo: LVEF 20-25%.    08/2018 BiV AICD placed, followed by EP - normal device check 11/2020   03/2019 echo: LVEF 55-60%,      - no SOB/DOE, no LE edema.  - compliant with meds 02/2022 normal device check     2. PAF -  noted device checks 11/2018 and 02/2019 - CHADS2Vasc score is 5 (CHF, HTN, age x1, CAD,gender)   - no palpitations - no bleeding on eliquis   3. CAD - 12/2017 cath as reported below, overall mild to moderate disease - occasional tightness midchest, 2-3/10 in severity. Can occur at rest or with exertion but more commonly with exertion. No orther associated symtpoms. Lasts about 5 minutes, few times a month.    4. Hyperlipidmeia -upcoming labs with pcp next month - 08/2021 TC 197 HDL 70 TG 80 LDL 109. 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   Previously retired at [employer].    Past Medical History:  Diagnosis Date   Acquired absence of both cervix and uterus    Anxiety disorder    Asthma    Degenerative joint disease of hand    Diverticulosis    Fatigue    H/O: osteoarthritis    History of vitamin A deficiency    Hyperlipidemia    Hypertension    Keratosis, seborrheic    Lipoma    Ovarian failure    Rhinitis, allergic    Vitamin D deficiency      Allergies  Allergen Reactions   Lisinopril Cough   Vioxx [Rofecoxib] Other (See Comments)    Epigastric discomfort      Current Outpatient  Medications  Medication Sig Dispense Refill   Cholecalciferol (VITAMIN D PO) Take 1 capsule by mouth daily.     ELIQUIS 5 MG TABS tablet TAKE 1 TABLET BY MOUTH TWICE DAILY 60 tablet 5   ENTRESTO 97-103 MG TAKE 1 TABLET BY MOUTH TWICE DAILY 60 tablet 3   furosemide (LASIX) 20 MG tablet Take 1 tablet (20 mg total) by mouth daily. 90 tablet 3   metoprolol succinate (TOPROL-XL) 25 MG 24 hr tablet TAKE 1 AND 1/2 TABLETS BY MOUTH DAILY 135 tablet 3   potassium chloride (K-DUR) 10 MEQ tablet Take 10 mEq by mouth daily.     pravastatin (PRAVACHOL) 80 MG tablet Take 1 tablet (80 mg total) by mouth daily. 90 tablet 3   No current facility-administered medications for this visit.     Past Surgical History:  Procedure Laterality Date   BIV ICD INSERTION CRT-D N/A 09/12/2018   Procedure: BIV ICD INSERTION CRT-D;  Surgeon: Thompson Grayer, MD;  Location: [place] [facility];  Service: Cardiovascular;  Laterality: N/A;   RIGHT/LEFT HEART CATH AND CORONARY ANGIOGRAPHY N/A 02/15/2018   Procedure: RIGHT/LEFT HEART CATH AND CORONARY ANGIOGRAPHY;  Surgeon: Troy Sine, MD;  Location: [place] [facility];  Service: Cardiovascular;  Laterality: N/A;   TOTAL ABDOMINAL HYSTERECTOMY  1979     Allergies  Allergen Reactions   Lisinopril Cough   Vioxx [Rofecoxib] Other (See Comments)    Epigastric discomfort       Family History  Problem Relation Age of Onset   CVA Mother    Brain cancer Father      Social History Ms. Defelice reports that she quit smoking about 23 years ago. Her smoking use included cigarettes. She has never used smokeless tobacco. Ms. Finder reports that she does not currently use alcohol.   Review of Systems CONSTITUTIONAL: No weight loss, fever, chills, weakness or fatigue.  HEENT: Eyes: No visual loss, blurred vision, double vision or yellow sclerae.No hearing loss, sneezing, congestion, runny nose or sore throat.  SKIN: No rash or itching.  CARDIOVASCULAR: per  hpi RESPIRATORY: No shortness of breath, cough or sputum.  GASTROINTESTINAL: No anorexia, nausea, vomiting or diarrhea. No abdominal pain or blood.  GENITOURINARY: No burning on urination, no polyuria NEUROLOGICAL: No headache, dizziness, syncope, paralysis, ataxia, numbness or tingling in the extremities. No change in bowel or bladder control.  MUSCULOSKELETAL: No muscle, back pain, joint pain or stiffness.  LYMPHATICS: No enlarged nodes. No history of splenectomy.  PSYCHIATRIC: No history of depression or anxiety.  ENDOCRINOLOGIC: No reports of sweating, cold or heat intolerance. No polyuria or polydipsia.  Marland Kitchen   Physical Examination Today's Vitals   03/25/22 0814  BP: 136/70  Pulse: 60  SpO2: 98%  Weight: 146 lb 6.4 oz (66.4 kg)  Height: '5\' 3"'$  (1.6 m)   Body mass index is 25.93 kg/m.  Gen: resting comfortably, no acute distress HEENT: no scleral icterus, pupils equal round and reactive, no palptable cervical adenopathy,  CV: RRR, no m/r/g, no jvd Resp: Clear to auscultation bilaterally GI: abdomen is soft, non-tender, non-distended, normal bowel sounds, no hepatosplenomegaly MSK: extremities are warm, no edema.  Skin: warm, no rash Neuro:  no focal deficits Psych: appropriate affect   Diagnostic Studies  12/2017 cath Dist RCA lesion is 55% stenosed. Mid LM to Dist LM lesion is 20% stenosed. Ost Cx lesion is 20% stenosed. Prox LAD lesion is 30% stenosed. Prox RCA lesion is 20% stenosed. Mid RCA lesion is 30% stenosed.   There is evidence for diffuse coronary calcification involving all coronary arteries.   Left main stenosis with distal 20% narrowing; proximal irregularity of the LAD with 30% proximal stenosis; 20% ostial stenosis of the left circumflex vessel; and very large dominant RCA with mild to moderate calcification and irregularity with 20% proximal 30% mid and 50 to 60% smooth stenosis immediately proximal to the takeoff of a large PDA and PLA vessel with 20%  PLA narrowing.   RECOMMENDATION: Guideline directed medical therapy for the patient's cardiomyopathy which most likely is of nonischemic etiology.     Jan 2020 echo Study Conclusions   - Procedure narrative: Transthoracic echocardiography. Image   quality was fair. The study was technically difficult, as a   result of poor acoustic windows and body habitus. - Left ventricle: The cavity size was normal. Wall thickness was   normal. Systolic function was severely reduced. The estimated   ejection fraction was in the range of 20% to 25%. Diffuse   hypokinesis. Doppler parameters are consistent with abnormal left   ventricular relaxation (grade 1 diastolic dysfunction). - Ventricular septum: Septal motion showed abnormal function and   dyssynergy. These changes are consistent with a left bundle   Janney Priego block. -  Mitral valve: Mildly to moderately calcified annulus. There was   mild regurgitation. - Left atrium: The atrium was mildly dilated. - Atrial septum: No defect or patent foramen ovale was identified.     Assessment and Plan   1 Chronic systolic HF - LVEF has normalized - denies any symptoms, continue current meds  2. PAF/acquired thrombophilia - no symptoms, continue current meds   3. CAD - some recent chest tightness. She had mild to mod CAD by cath in 2019, will plan for lexiscan to further evaluate.    4. Hyperlipidemia - increased LDL back in 09/2021 due to elevated LDL, request pcp labs to see if have been updated - continue pravastatin.    F/u 6 months  Arnoldo Lenis, M.D.

## 2022-03-25 NOTE — Telephone Encounter (Signed)
Checking percert on the following patient for testing scheduled at [facility]     Kathleen Edwards   - 04-02-2022

## 2022-03-31 ENCOUNTER — Telehealth: Payer: Self-pay | Admitting: *Deleted

## 2022-03-31 NOTE — Patient Outreach (Signed)
  Care Coordination   03/31/2022 Name: Kathleen Edwards MRN: [id number] DOB: 02-Apr-1946   Care Coordination Outreach Attempts:  An unsuccessful telephone outreach was attempted today to offer the patient information about available care coordination services as a benefit of their health plan.   Follow Up Plan:  Additional outreach attempts will be made to offer the patient care coordination information and services.   Encounter Outcome:  No Answer  Care Coordination Interventions Activated:  No   Care Coordination Interventions:  No, not indicated    Emelia Loron RN, BSN [place] [phone] [email or web address]

## 2022-04-02 ENCOUNTER — Encounter (HOSPITAL_COMMUNITY)
Admission: RE | Admit: 2022-04-02 | Discharge: 2022-04-02 | Disposition: A | Discharge status: Home / Self Care | Payer: Medicare Other | Source: Ambulatory Visit | Attending: Cardiology | Admitting: Cardiology

## 2022-04-02 ENCOUNTER — Ambulatory Visit (HOSPITAL_COMMUNITY)
Admission: RE | Admit: 2022-04-02 | Discharge: 2022-04-02 | Disposition: A | Discharge status: Home / Self Care | Payer: Medicare Other | Source: Ambulatory Visit | Attending: Cardiology | Admitting: Cardiology

## 2022-04-02 DIAGNOSIS — R079 Chest pain, unspecified: Secondary | ICD-10-CM | POA: Insufficient documentation

## 2022-04-02 LAB — NM MYOCAR MULTI W/SPECT W/WALL MOTION / EF
LV dias vol: 70 mL (ref 46–106)
LV sys vol: 27 mL
Nuc Stress EF: 61 %
Peak HR: 109 {beats}/min
RATE: 0.4
Rest HR: 60 {beats}/min
Rest Nuclear Isotope Dose: 10 mCi
SDS: 4
SRS: 2
SSS: 6
ST Depression (mm): 0 mm
Stress Nuclear Isotope Dose: 33 mCi
TID: 1.44

## 2022-04-02 MED ORDER — SODIUM CHLORIDE FLUSH 0.9 % IV SOLN
INTRAVENOUS | Status: AC
  Administered 2022-04-02: 10 mL via INTRAVENOUS
  Filled 2022-04-02: qty 10

## 2022-04-02 MED ORDER — TECHNETIUM TC 99M TETROFOSMIN IV KIT
30.0000 | PACK | Freq: Once | INTRAVENOUS | Status: AC | PRN
  Administered 2022-04-02: 33 via INTRAVENOUS

## 2022-04-02 MED ORDER — REGADENOSON 0.4 MG/5ML IV SOLN
INTRAVENOUS | Status: AC
  Administered 2022-04-02: 0.4 mg via INTRAVENOUS
  Filled 2022-04-02: qty 5

## 2022-04-02 MED ORDER — TECHNETIUM TC 99M TETROFOSMIN IV KIT
10.0000 | PACK | Freq: Once | INTRAVENOUS | Status: AC | PRN
  Administered 2022-04-02: 10 via INTRAVENOUS

## 2022-04-09 NOTE — Progress Notes (Signed)
Remote ICD transmission.   

## 2022-04-12 ENCOUNTER — Ambulatory Visit (INDEPENDENT_AMBULATORY_CARE_PROVIDER_SITE_OTHER): Payer: Medicare Other

## 2022-04-12 DIAGNOSIS — Z95810 Presence of automatic (implantable) cardiac defibrillator: Secondary | ICD-10-CM

## 2022-04-12 DIAGNOSIS — I5022 Chronic systolic (congestive) heart failure: Secondary | ICD-10-CM

## 2022-04-14 ENCOUNTER — Telehealth: Payer: Self-pay | Admitting: *Deleted

## 2022-04-14 MED ORDER — ISOSORBIDE MONONITRATE ER 30 MG PO TB24: 15.0000 mg | ORAL_TABLET | Freq: Every day | ORAL | 6 refills | Status: DC

## 2022-04-14 NOTE — Telephone Encounter (Signed)
-----   Message from Berlinda Last, [place] sent at 04/12/2022  3:45 PM EDT -----  ----- Message ----- From: Arnoldo Lenis, MD Sent: 04/12/2022   3:36 PM EDT To: Berlinda Last, CMA  Stress test overall looks good. Very small area was slighly abnormal, typically just something we would manage with medications. If ongoing chest tightness could try imdur '15mg'$  daily and see if helps  Zandra Abts MD

## 2022-04-14 NOTE — Telephone Encounter (Signed)
Laurine Blazer, LPN  5/63/1497  0:26 AM EDT Back to Top    Notified yesterday evening, copy to pcp.  New medication sent to pharmacy.

## 2022-04-16 NOTE — Progress Notes (Signed)
EPIC Encounter for ICM Monitoring  Patient Name: Kathleen Edwards is a 76 y.o. female Date: 04/16/2022 Primary Care Physican: Glenda Chroman, MD Primary Cardiologist: Branch Electrophysiologist:  [name]: >99% 03/25/2022 Office Weight: 146 lbs    AT/AF Burden: <1% (taking Eliquis)                                                            Transmission reviewed.    CorVue thoracic impedance suggesting normal fluid levels with exception of intermittent days with possible fluid accumulation during 9/5-9/12.   Prescribed: Furosemide 20 mg take 1 tablet daily. Potassium 10 mEq take 1 tablet daily   Labs: 09/17/2021 Creatinine 0.74, BUN 19, Potassium 3.9, Sodium 140, GFR 84 12/24/2020 Creatinine 0.73, BUN 21, Potassium 4.0, Sodium 142, GFR 86 A complete set of results can be found in Results Review.   Recommendations:  No changes.   Follow-up plan: ICM clinic phone appointment on 05/17/2022.   91 day device clinic remote transmission 06/08/2022.    EP/Cardiology Office Visits: 10/07/2022 with Dr Harl Bowie.  05/28/2022 with Dr Rayann Heman.   Copy of ICM check sent to Dr. Myles Gip.     3 month ICM trend: 04/12/2022.    12-14 Month ICM trend:     Rosalene Billings, RN 04/16/2022 4:29 PM

## 2022-04-20 ENCOUNTER — Telehealth: Payer: Self-pay | Admitting: *Deleted

## 2022-04-20 NOTE — Patient Outreach (Signed)
  Care Coordination   04/20/2022 Name: RILEYANN FLORANCE MRN: [id number] DOB: October 16, 1945   Care Coordination Outreach Attempts:  A second unsuccessful outreach was attempted today to offer the patient with information about available care coordination services as a benefit of their health plan.     Follow Up Plan:  Additional outreach attempts will be made to offer the patient care coordination information and services.   Encounter Outcome:  No Answer  Care Coordination Interventions Activated:  No   Care Coordination Interventions:  No, not indicated    Valente David, RN, MSN, [facility] Care Management Care Management Coordinator [phone]

## 2022-04-22 ENCOUNTER — Telehealth: Payer: Self-pay | Admitting: *Deleted

## 2022-04-22 NOTE — Patient Outreach (Signed)
  Care Coordination   04/22/2022 Name: Kathleen Edwards MRN: [id number] DOB: 1945/11/01   Care Coordination Outreach Attempts:  A third unsuccessful outreach was attempted today to offer the patient with information about available care coordination services as a benefit of their health plan.   Follow Up Plan:  No further outreach attempts will be made at this time. We have been unable to contact the patient to offer or enroll patient in care coordination services  Encounter Outcome:  No Answer  Care Coordination Interventions Activated:  No   Care Coordination Interventions:  No, not indicated    Valente David, RN, MSN, [facility] Care Management Care Management Coordinator [phone]

## 2022-05-04 ENCOUNTER — Encounter: Payer: Self-pay | Admitting: Cardiovascular Disease

## 2022-05-17 ENCOUNTER — Ambulatory Visit (INDEPENDENT_AMBULATORY_CARE_PROVIDER_SITE_OTHER): Payer: Medicare Other

## 2022-05-17 DIAGNOSIS — I5022 Chronic systolic (congestive) heart failure: Secondary | ICD-10-CM

## 2022-05-17 DIAGNOSIS — Z95810 Presence of automatic (implantable) cardiac defibrillator: Secondary | ICD-10-CM

## 2022-05-17 NOTE — Progress Notes (Signed)
EPIC Encounter for ICM Monitoring  Patient Name: Kathleen Edwards is a 76 y.o. female Date: 05/17/2022 Primary Care Physican: Glenda Chroman, MD Primary Cardiologist: Branch Electrophysiologist:  [name]: >99% 03/25/2022 Office Weight: 146 lbs    AT/AF Burden: <1% (taking Eliquis)                                                            Transmission reviewed.    CorVue thoracic impedance suggesting normal fluid levels with exception of days with possible fluid accumulation during 10/7-10/19.   Prescribed: Furosemide 20 mg take 1 tablet daily. Potassium 10 mEq take 1 tablet daily   Labs: 09/17/2021 Creatinine 0.74, BUN 19, Potassium 3.9, Sodium 140, GFR 84 12/24/2020 Creatinine 0.73, BUN 21, Potassium 4.0, Sodium 142, GFR 86 A complete set of results can be found in Results Review.   Recommendations:  No changes.   Follow-up plan: ICM clinic phone appointment on 06/28/2022.   91 day device clinic remote transmission 06/08/2022.    EP/Cardiology Office Visits: 10/07/2022 with Dr Harl Bowie.  06/01/2022 with Dr Myles Gip.   Copy of ICM check sent to Dr. Myles Gip.    3 month ICM trend: 05/17/2022.    12-14 Month ICM trend:     Rosalene Billings, RN 05/17/2022 5:46 PM

## 2022-05-28 ENCOUNTER — Encounter: Payer: Medicare Other | Admitting: Internal Medicine

## 2022-06-01 ENCOUNTER — Encounter: Payer: Self-pay | Admitting: Cardiovascular Disease

## 2022-06-01 ENCOUNTER — Ambulatory Visit: Payer: Medicare Other | Attending: Internal Medicine | Admitting: Cardiovascular Disease

## 2022-06-01 VITALS — BP 132/70 | HR 74 | Ht 63.5 in | Wt 144.0 lb

## 2022-06-01 DIAGNOSIS — I480 Paroxysmal atrial fibrillation: Secondary | ICD-10-CM | POA: Diagnosis not present

## 2022-06-01 DIAGNOSIS — Z95810 Presence of automatic (implantable) cardiac defibrillator: Secondary | ICD-10-CM | POA: Diagnosis not present

## 2022-06-01 DIAGNOSIS — I5022 Chronic systolic (congestive) heart failure: Secondary | ICD-10-CM | POA: Diagnosis not present

## 2022-06-01 NOTE — Patient Instructions (Signed)
Medication Instructions:  Continue all current medications.  Labwork: none  Testing/Procedures: none  Follow-Up: 1 year - Dr.  Mealor    Any Other Special Instructions Will Be Listed Below (If Applicable).   If you need a refill on your cardiac medications before your next appointment, please call your pharmacy.  

## 2022-06-01 NOTE — Progress Notes (Signed)
PCP: Glenda Chroman, MD Primary Cardiologist: Dr Harl Bowie Primary EP: Dr Myles Gip  Kathleen Edwards is a 76 y.o. female who presents today for routine electrophysiology followup.  Since last being seen in our clinic, the patient reports doing very well.  Today, she denies symptoms of palpitations, chest pain, shortness of breath,  lower extremity edema, dizziness, presyncope, syncope, or ICD shocks.  The patient is otherwise without complaint today. she has no device related complaints -- no new tenderness, drainage, redness.   Past Medical History:  Diagnosis Date   Acquired absence of both cervix and uterus    Anxiety disorder    Asthma    Degenerative joint disease of hand    Diverticulosis    Fatigue    H/O: osteoarthritis    History of vitamin A deficiency    Hyperlipidemia    Hypertension    Keratosis, seborrheic    Lipoma    Ovarian failure    Rhinitis, allergic    Vitamin D deficiency    Past Surgical History:  Procedure Laterality Date   BIV ICD INSERTION CRT-D N/A 09/12/2018   Procedure: BIV ICD INSERTION CRT-D;  Surgeon: Thompson Grayer, MD;  Location: [place] [facility];  Service: Cardiovascular;  Laterality: N/A;   RIGHT/LEFT HEART CATH AND CORONARY ANGIOGRAPHY N/A 02/15/2018   Procedure: RIGHT/LEFT HEART CATH AND CORONARY ANGIOGRAPHY;  Surgeon: Troy Sine, MD;  Location: [place] [facility];  Service: Cardiovascular;  Laterality: N/A;   TOTAL ABDOMINAL HYSTERECTOMY  1979    ROS- all systems are reviewed and negative except as per HPI above  Current Outpatient Medications  Medication Sig Dispense Refill   Cholecalciferol (VITAMIN D PO) Take 1 capsule by mouth daily.     ELIQUIS 5 MG TABS tablet TAKE 1 TABLET BY MOUTH TWICE DAILY 60 tablet 5   ENTRESTO 97-103 MG TAKE 1 TABLET BY MOUTH TWICE DAILY 60 tablet 3   furosemide (LASIX) 20 MG tablet Take 1 tablet (20 mg total) by mouth daily. 90 tablet 3   isosorbide mononitrate (IMDUR) 30 MG 24 hr tablet Take 0.5  tablets (15 mg total) by mouth daily. 15 tablet 6   metoprolol succinate (TOPROL-XL) 25 MG 24 hr tablet TAKE 1 AND 1/2 TABLETS BY MOUTH DAILY 135 tablet 3   potassium chloride (K-DUR) 10 MEQ tablet Take 10 mEq by mouth daily.     pravastatin (PRAVACHOL) 80 MG tablet Take 1 tablet (80 mg total) by mouth daily. 90 tablet 3   No current facility-administered medications for this visit.    Physical Exam: Vitals:   06/01/22 1255  BP: 132/70  Pulse: 74  SpO2: 98%  Weight: 144 lb (65.3 kg)  Height: 5' 3.5" (1.613 m)     GEN- The patient is well appearing, alert and oriented x 3 today.   Head- normocephalic, atraumatic Eyes-  Sclera clear, conjunctiva pink Ears- hearing intact Oropharynx- clear Lungs- Clear to ausculation bilaterally, normal work of breathing Chest- ICD pocket is well healed Heart- Regular rate and rhythm, no murmurs, rubs or gallops, PMI not laterally displaced GI- soft, NT, ND, + BS Extremities- no clubbing, cyanosis, or edema  ICD interrogation- reviewed in detail today,  See PACEART report  ekg tracing ordered today is personally reviewed and shows sinus with BiV pacing  Wt Readings from Last 3 Encounters:  06/01/22 144 lb (65.3 kg)  03/25/22 146 lb 6.4 oz (66.4 kg)  09/17/21 144 lb 9.6 oz (65.6 kg)    Assessment and  Plan:  1.  Chronic systolic dysfunction/ nonischemic CM euvolemic today Stable on an appropriate medical regimen Normal BiV ICD function EF has recovered with CRT See Pace Art report No changes today she is not device dependant today followed in ICM device clinic  2. Paroxysmal atrial fibrillation Burden remains <1% Chads2vasc score is 4.  She is on eliquis  Return in a year  Melida Quitter, MD  06/01/2022 1:23 PM

## 2022-06-08 ENCOUNTER — Ambulatory Visit (INDEPENDENT_AMBULATORY_CARE_PROVIDER_SITE_OTHER): Payer: Medicare Other

## 2022-06-08 DIAGNOSIS — I428 Other cardiomyopathies: Secondary | ICD-10-CM | POA: Diagnosis not present

## 2022-06-08 LAB — CUP PACEART REMOTE DEVICE CHECK
Battery Remaining Longevity: 42 mo
Battery Remaining Percentage: 52 %
Battery Voltage: 2.95 V
Brady Statistic AP VP Percent: 19 %
Brady Statistic AP VS Percent: 1 %
Brady Statistic AS VP Percent: 81 %
Brady Statistic AS VS Percent: 1 %
Brady Statistic RA Percent Paced: 18 %
Date Time Interrogation Session: 20231114020018
HighPow Impedance: 71 Ohm
HighPow Impedance: 71 Ohm
Implantable Lead Connection Status: 753985
Implantable Lead Connection Status: 753985
Implantable Lead Connection Status: 753985
Implantable Lead Implant Date: 20200218
Implantable Lead Implant Date: 20200218
Implantable Lead Implant Date: 20200218
Implantable Lead Location: 753858
Implantable Lead Location: 753859
Implantable Lead Location: 753860
Implantable Pulse Generator Implant Date: 20200218
Lead Channel Impedance Value: 1075 Ohm
Lead Channel Impedance Value: 400 Ohm
Lead Channel Impedance Value: 510 Ohm
Lead Channel Pacing Threshold Amplitude: 0.625 V
Lead Channel Pacing Threshold Amplitude: 1.125 V
Lead Channel Pacing Threshold Amplitude: 2.5 V
Lead Channel Pacing Threshold Pulse Width: 0.4 ms
Lead Channel Pacing Threshold Pulse Width: 0.5 ms
Lead Channel Pacing Threshold Pulse Width: 0.7 ms
Lead Channel Sensing Intrinsic Amplitude: 1.1 mV
Lead Channel Sensing Intrinsic Amplitude: 10.4 mV
Lead Channel Setting Pacing Amplitude: 2 V
Lead Channel Setting Pacing Amplitude: 2.125
Lead Channel Setting Pacing Amplitude: 3 V
Lead Channel Setting Pacing Pulse Width: 0.4 ms
Lead Channel Setting Pacing Pulse Width: 0.7 ms
Lead Channel Setting Sensing Sensitivity: 0.5 mV
Pulse Gen Serial Number: 9880584
Zone Setting Status: 755011

## 2022-06-16 ENCOUNTER — Other Ambulatory Visit: Payer: Self-pay | Admitting: Cardiology

## 2022-06-28 ENCOUNTER — Ambulatory Visit (INDEPENDENT_AMBULATORY_CARE_PROVIDER_SITE_OTHER): Payer: Medicare Other

## 2022-06-28 DIAGNOSIS — I5022 Chronic systolic (congestive) heart failure: Secondary | ICD-10-CM

## 2022-06-28 DIAGNOSIS — Z95810 Presence of automatic (implantable) cardiac defibrillator: Secondary | ICD-10-CM | POA: Diagnosis not present

## 2022-06-29 NOTE — Progress Notes (Signed)
EPIC Encounter for ICM Monitoring  Patient Name: Kathleen Edwards is a 76 y.o. female Date: 06/29/2022 Primary Care Physican: Glenda Chroman, MD Primary Cardiologist: Branch Electrophysiologist:  [name]: 99% 03/25/2022 Office Weight: 146 lbs  06/29/2022 Weight: 146 lbs   AT/AF Burden: <1% (taking Eliquis)                                                            ASpoke with patient and heart failure questions reviewed.  Transmission results reviewed.  Pt asymptomatic for fluid accumulation.  Reports feeling well at this time and voices no complaints.  She may have been eating snack foods high in salt.     CorVue thoracic impedance suggesting possible fluid accumulation starting 12/2 but trending close to baseline 12/4.  Also decreased impedance from 11/21-11/29.   Prescribed: Furosemide 20 mg take 1 tablet daily. Potassium 10 mEq take 1 tablet daily   Labs: 09/17/2021 Creatinine 0.74, BUN 19, Potassium 3.9, Sodium 140, GFR 84 Care Everywhere 12/24/2020 Creatinine 0.73, BUN 21, Potassium 4.0, Sodium 142, GFR 86 A complete set of results can be found in Results Review.   Recommendations:  Recommendation to limit salt intake to 2000 mg daily and fluid intake to 64 oz daily.  Encouraged to call if experiencing any fluid symptoms.      Follow-up plan: ICM clinic phone appointment on 08/09/2022.   91 day device clinic remote transmission 09/07/2022.    EP/Cardiology Office Visits: 10/07/2022 with Dr Harl Bowie.  Recall 06/01/2023 with Dr Myles Gip.   Copy of ICM check sent to Dr. Myles Gip.     3 month ICM trend: 06/28/2022.    12-14 Month ICM trend:     Rosalene Billings, RN 06/29/2022 10:20 AM

## 2022-07-07 NOTE — Progress Notes (Signed)
Remote ICD transmission.   

## 2022-07-12 ENCOUNTER — Other Ambulatory Visit: Payer: Self-pay | Admitting: Cardiology

## 2022-07-17 DIAGNOSIS — I700 Atherosclerosis of aorta: Secondary | ICD-10-CM | POA: Diagnosis not present

## 2022-07-17 DIAGNOSIS — I509 Heart failure, unspecified: Secondary | ICD-10-CM | POA: Diagnosis not present

## 2022-07-17 DIAGNOSIS — K5730 Diverticulosis of large intestine without perforation or abscess without bleeding: Secondary | ICD-10-CM | POA: Diagnosis not present

## 2022-07-17 DIAGNOSIS — R1013 Epigastric pain: Secondary | ICD-10-CM | POA: Diagnosis not present

## 2022-07-17 DIAGNOSIS — R110 Nausea: Secondary | ICD-10-CM | POA: Diagnosis not present

## 2022-07-17 DIAGNOSIS — I110 Hypertensive heart disease with heart failure: Secondary | ICD-10-CM | POA: Diagnosis not present

## 2022-07-17 DIAGNOSIS — R431 Parosmia: Secondary | ICD-10-CM | POA: Diagnosis not present

## 2022-07-17 DIAGNOSIS — R079 Chest pain, unspecified: Secondary | ICD-10-CM | POA: Diagnosis not present

## 2022-07-17 DIAGNOSIS — Z95810 Presence of automatic (implantable) cardiac defibrillator: Secondary | ICD-10-CM | POA: Diagnosis not present

## 2022-07-17 DIAGNOSIS — I10 Essential (primary) hypertension: Secondary | ICD-10-CM | POA: Diagnosis not present

## 2022-07-17 DIAGNOSIS — R0781 Pleurodynia: Secondary | ICD-10-CM | POA: Diagnosis not present

## 2022-07-17 DIAGNOSIS — N200 Calculus of kidney: Secondary | ICD-10-CM | POA: Diagnosis not present

## 2022-07-17 DIAGNOSIS — R059 Cough, unspecified: Secondary | ICD-10-CM | POA: Diagnosis not present

## 2022-07-17 DIAGNOSIS — R109 Unspecified abdominal pain: Secondary | ICD-10-CM | POA: Diagnosis not present

## 2022-07-28 DIAGNOSIS — I700 Atherosclerosis of aorta: Secondary | ICD-10-CM | POA: Diagnosis not present

## 2022-07-28 DIAGNOSIS — Z789 Other specified health status: Secondary | ICD-10-CM | POA: Diagnosis not present

## 2022-07-28 DIAGNOSIS — Z299 Encounter for prophylactic measures, unspecified: Secondary | ICD-10-CM | POA: Diagnosis not present

## 2022-07-28 DIAGNOSIS — I10 Essential (primary) hypertension: Secondary | ICD-10-CM | POA: Diagnosis not present

## 2022-07-28 DIAGNOSIS — R1011 Right upper quadrant pain: Secondary | ICD-10-CM | POA: Diagnosis not present

## 2022-07-30 DIAGNOSIS — D734 Cyst of spleen: Secondary | ICD-10-CM | POA: Diagnosis not present

## 2022-07-30 DIAGNOSIS — N200 Calculus of kidney: Secondary | ICD-10-CM | POA: Diagnosis not present

## 2022-07-30 DIAGNOSIS — R1011 Right upper quadrant pain: Secondary | ICD-10-CM | POA: Diagnosis not present

## 2022-08-09 ENCOUNTER — Ambulatory Visit (INDEPENDENT_AMBULATORY_CARE_PROVIDER_SITE_OTHER): Payer: Medicare Other

## 2022-08-09 DIAGNOSIS — I5022 Chronic systolic (congestive) heart failure: Secondary | ICD-10-CM

## 2022-08-09 DIAGNOSIS — Z95810 Presence of automatic (implantable) cardiac defibrillator: Secondary | ICD-10-CM | POA: Diagnosis not present

## 2022-08-10 DIAGNOSIS — I10 Essential (primary) hypertension: Secondary | ICD-10-CM | POA: Diagnosis not present

## 2022-08-10 DIAGNOSIS — D7389 Other diseases of spleen: Secondary | ICD-10-CM | POA: Diagnosis not present

## 2022-08-10 DIAGNOSIS — Z299 Encounter for prophylactic measures, unspecified: Secondary | ICD-10-CM | POA: Diagnosis not present

## 2022-08-10 DIAGNOSIS — I5020 Unspecified systolic (congestive) heart failure: Secondary | ICD-10-CM | POA: Diagnosis not present

## 2022-08-10 DIAGNOSIS — Z87891 Personal history of nicotine dependence: Secondary | ICD-10-CM | POA: Diagnosis not present

## 2022-08-10 DIAGNOSIS — I429 Cardiomyopathy, unspecified: Secondary | ICD-10-CM | POA: Diagnosis not present

## 2022-08-12 NOTE — Progress Notes (Signed)
EPIC Encounter for ICM Monitoring  Patient Name: Kathleen Edwards is a 77 y.o. female Date: 08/12/2022 Primary Care Physican: Glenda Chroman, MD Primary Cardiologist: Branch Electrophysiologist:  [name]: 99% 03/25/2022 Office Weight: 146 lbs  06/29/2022 Weight: 146 lbs   AT/AF Burden: <1% (taking Eliquis)                                                            Transmission reviewed.    CorVue thoracic impedance suggesting normal fluid levels.   Prescribed: Furosemide 20 mg take 1 tablet daily. Potassium 10 mEq take 1 tablet daily   Labs: 09/17/2021 Creatinine 0.74, BUN 19, Potassium 3.9, Sodium 140, GFR 84 Care Everywhere 12/24/2020 Creatinine 0.73, BUN 21, Potassium 4.0, Sodium 142, GFR 86 A complete set of results can be found in Results Review.   Recommendations:  No changes      Follow-up plan: ICM clinic phone appointment on 09/14/2022.   91 day device clinic remote transmission 09/07/2022.    EP/Cardiology Office Visits: 10/07/2022 with Dr Harl Bowie.  Recall 06/01/2023 with Dr Myles Gip.   Copy of ICM check sent to Dr. Myles Gip.    3 month ICM trend: 08/09/2022.    12-14 Month ICM trend:     Rosalene Billings, RN 08/12/2022 12:35 PM

## 2022-09-07 ENCOUNTER — Ambulatory Visit: Payer: Medicare Other

## 2022-09-07 DIAGNOSIS — I428 Other cardiomyopathies: Secondary | ICD-10-CM

## 2022-09-07 LAB — CUP PACEART REMOTE DEVICE CHECK
Battery Remaining Longevity: 38 mo
Battery Remaining Percentage: 49 %
Battery Voltage: 2.95 V
Brady Statistic AP VP Percent: 17 %
Brady Statistic AP VS Percent: 1 %
Brady Statistic AS VP Percent: 82 %
Brady Statistic AS VS Percent: 1 %
Brady Statistic RA Percent Paced: 17 %
Date Time Interrogation Session: 20240213020016
HighPow Impedance: 64 Ohm
HighPow Impedance: 64 Ohm
Implantable Lead Connection Status: 753985
Implantable Lead Connection Status: 753985
Implantable Lead Connection Status: 753985
Implantable Lead Implant Date: 20200218
Implantable Lead Implant Date: 20200218
Implantable Lead Implant Date: 20200218
Implantable Lead Location: 753858
Implantable Lead Location: 753859
Implantable Lead Location: 753860
Implantable Pulse Generator Implant Date: 20200218
Lead Channel Impedance Value: 1050 Ohm
Lead Channel Impedance Value: 380 Ohm
Lead Channel Impedance Value: 440 Ohm
Lead Channel Pacing Threshold Amplitude: 0.625 V
Lead Channel Pacing Threshold Amplitude: 0.75 V
Lead Channel Pacing Threshold Amplitude: 2.375 V
Lead Channel Pacing Threshold Pulse Width: 0.4 ms
Lead Channel Pacing Threshold Pulse Width: 0.5 ms
Lead Channel Pacing Threshold Pulse Width: 0.7 ms
Lead Channel Sensing Intrinsic Amplitude: 1.1 mV
Lead Channel Sensing Intrinsic Amplitude: 11.4 mV
Lead Channel Setting Pacing Amplitude: 1.75 V
Lead Channel Setting Pacing Amplitude: 2 V
Lead Channel Setting Pacing Amplitude: 2.875
Lead Channel Setting Pacing Pulse Width: 0.4 ms
Lead Channel Setting Pacing Pulse Width: 0.7 ms
Lead Channel Setting Sensing Sensitivity: 0.5 mV
Pulse Gen Serial Number: 9880584
Zone Setting Status: 755011

## 2022-09-14 ENCOUNTER — Ambulatory Visit: Payer: Medicare Other

## 2022-09-14 DIAGNOSIS — Z95810 Presence of automatic (implantable) cardiac defibrillator: Secondary | ICD-10-CM | POA: Diagnosis not present

## 2022-09-14 DIAGNOSIS — I5022 Chronic systolic (congestive) heart failure: Secondary | ICD-10-CM

## 2022-09-17 NOTE — Progress Notes (Signed)
EPIC Encounter for ICM Monitoring  Patient Name: Kathleen Edwards is a 77 y.o. female Date: 09/17/2022 Primary Care Physican: Glenda Chroman, MD Primary Cardiologist: Branch Electrophysiologist:  [name]: 99% 03/25/2022 Office Weight: 146 lbs  06/29/2022 Weight: 146 lbs   AT/AF Burden: <1% (taking Eliquis)                                                            Transmission reviewed.    CorVue thoracic impedance suggesting normal fluid levels.   Prescribed: Furosemide 20 mg take 1 tablet daily. Potassium 10 mEq take 1 tablet daily   Labs: 07/17/2022 Creatinine 0.83, BUN 21, Potassium 3.5, Sodium 139, GFR 73 A complete set of results can be found in Results Review.   Recommendations:  No changes.      Follow-up plan: ICM clinic phone appointment on 10/18/2022.   91 day device clinic remote transmission 12/07/2022.    EP/Cardiology Office Visits: 10/07/2022 with Dr Harl Bowie.  Recall 06/01/2023 with Dr Myles Gip.   Copy of ICM check sent to Dr. Myles Gip.     3 month ICM trend: 09/14/2022.    12-14 Month ICM trend:     Rosalene Billings, RN 09/17/2022 3:54 PM

## 2022-09-22 ENCOUNTER — Other Ambulatory Visit: Payer: Self-pay | Admitting: Cardiology

## 2022-09-22 NOTE — Telephone Encounter (Signed)
Prescription refill request for Eliquis received. Indication: PAF Last office visit: 06/01/22  A Mealor MD Scr: 0.83 on 07/17/22 Age: 76 Weight: 65.3kg  Based on above findings Eliquis '5mg'$  twice daily is the appropriate dose.  Refill approved.

## 2022-09-30 DIAGNOSIS — Z0000 Encounter for general adult medical examination without abnormal findings: Secondary | ICD-10-CM | POA: Diagnosis not present

## 2022-09-30 DIAGNOSIS — E559 Vitamin D deficiency, unspecified: Secondary | ICD-10-CM | POA: Diagnosis not present

## 2022-09-30 DIAGNOSIS — I10 Essential (primary) hypertension: Secondary | ICD-10-CM | POA: Diagnosis not present

## 2022-09-30 DIAGNOSIS — Z7189 Other specified counseling: Secondary | ICD-10-CM | POA: Diagnosis not present

## 2022-09-30 DIAGNOSIS — I5020 Unspecified systolic (congestive) heart failure: Secondary | ICD-10-CM | POA: Diagnosis not present

## 2022-09-30 DIAGNOSIS — J069 Acute upper respiratory infection, unspecified: Secondary | ICD-10-CM | POA: Diagnosis not present

## 2022-09-30 DIAGNOSIS — Z299 Encounter for prophylactic measures, unspecified: Secondary | ICD-10-CM | POA: Diagnosis not present

## 2022-09-30 DIAGNOSIS — I700 Atherosclerosis of aorta: Secondary | ICD-10-CM | POA: Diagnosis not present

## 2022-10-06 ENCOUNTER — Encounter: Payer: Self-pay | Admitting: *Deleted

## 2022-10-07 ENCOUNTER — Ambulatory Visit: Payer: Medicare Other | Attending: Cardiology | Admitting: Cardiology

## 2022-10-07 ENCOUNTER — Encounter: Payer: Self-pay | Admitting: Cardiology

## 2022-10-07 VITALS — BP 118/72 | HR 66 | Ht 64.0 in | Wt 137.8 lb

## 2022-10-07 DIAGNOSIS — I480 Paroxysmal atrial fibrillation: Secondary | ICD-10-CM

## 2022-10-07 DIAGNOSIS — I2510 Atherosclerotic heart disease of native coronary artery without angina pectoris: Secondary | ICD-10-CM

## 2022-10-07 DIAGNOSIS — E782 Mixed hyperlipidemia: Secondary | ICD-10-CM | POA: Diagnosis not present

## 2022-10-07 DIAGNOSIS — D6869 Other thrombophilia: Secondary | ICD-10-CM

## 2022-10-07 DIAGNOSIS — I5022 Chronic systolic (congestive) heart failure: Secondary | ICD-10-CM | POA: Diagnosis not present

## 2022-10-07 NOTE — Addendum Note (Signed)
Addended by: Sharee Holster R on: 10/07/2022 03:48 PM   Modules accepted: Orders

## 2022-10-07 NOTE — Patient Instructions (Signed)
Medication Instructions:  Continue all current medications.   Labwork: none  Testing/Procedures: none  Follow-Up: 6 months   Any Other Special Instructions Will Be Listed Below (If Applicable).   If you need a refill on your cardiac medications before your next appointment, please call your pharmacy.  

## 2022-10-07 NOTE — Progress Notes (Signed)
Clinical Summary Kathleen Edwards is a 77 y.o.female seen today for follow up fo the following medical problems.    1. HFimpEF - admitted 01/17/18 with new diagnosis of systolic HF.  - from notes presented with hypertensive emergency, started on nitro gtt and diuresed - EKG LBBB,  - 12/2017 echo [facility]: LVEF 20%  - 12/2017 cath with mild to moderate CAD. CI 3.2, PCWP 10, LVEDP 14. Overall normal CO and filling pressures   Jan 2020 echo: LVEF 20-25%.    08/2018 BiV AICD placed, followed by EP 03/2019 echo: LVEF 55-60%,  - 08/2022 normal device check  - no SOB/DOE, no LE edema - compliant with meds        2. PAF -  noted device checks 11/2018 and 02/2019 - CHADS2Vasc score is 5 (CHF, HTN, age x1, CAD,gender)   - no recent palpitations - no bleeding on eliquis   3. CAD - 12/2017 cath as reported below, overall mild to moderate disease - occasional tightness midchest, 2-3/10 in severity. Can occur at rest or with exertion but more commonly with exertion. No orther associated symtpoms. Lasts about 5 minutes, few times a month.   03/2022 nuclear stress: inferolateral iscehmia, mild and low risk - started on imdur 15 mg daily - no recurrent chest pain   4. Hyperlipidmeia -upcoming labs with pcp next month - 08/2021 TC 197 HDL 70 TG 80 LDL 109. 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   Previously retired at [employer].  Past Medical History:  Diagnosis Date   Acquired absence of both cervix and uterus    Anxiety disorder    Asthma    Degenerative joint disease of hand    Diverticulosis    Fatigue    H/O: osteoarthritis    History of vitamin A deficiency    Hyperlipidemia    Hypertension    Keratosis, seborrheic    Lipoma    Ovarian failure    Rhinitis, allergic    Vitamin D deficiency      Allergies  Allergen Reactions   Lisinopril Cough    Vioxx [Rofecoxib] Other (See Comments)    Epigastric discomfort      Current Outpatient Medications  Medication Sig Dispense Refill   Cholecalciferol (VITAMIN D PO) Take 1 capsule by mouth daily.     ELIQUIS 5 MG TABS tablet TAKE 1 TABLET BY MOUTH TWICE DAILY 60 tablet 5   furosemide (LASIX) 20 MG tablet TAKE 1 TABLET BY MOUTH DAILY 90 tablet 3   isosorbide mononitrate (IMDUR) 30 MG 24 hr tablet TAKE 1/2 TABLET BY MOUTH DAILY 45 tablet 3   metoprolol succinate (TOPROL-XL) 25 MG 24 hr tablet TAKE 1 AND 1/2 TABLETS BY MOUTH DAILY 135 tablet 3   potassium chloride (K-DUR) 10 MEQ tablet Take 10 mEq by mouth daily.     pravastatin (PRAVACHOL) 80 MG tablet Take 1 tablet (80 mg total) by mouth daily. 90 tablet 3   sacubitril-valsartan (ENTRESTO) 97-103 MG TAKE 1 TABLET BY MOUTH TWICE DAILY 60 tablet 6   No current facility-administered medications for this visit.     Past Surgical History:  Procedure Laterality Date   BIV ICD INSERTION CRT-D N/A 09/12/2018   Procedure: BIV ICD INSERTION CRT-D;  Surgeon: Thompson Grayer, MD;  Location: [place] [facility];  Service: Cardiovascular;  Laterality: N/A;   RIGHT/LEFT HEART CATH AND CORONARY ANGIOGRAPHY N/A 02/15/2018   Procedure: RIGHT/LEFT HEART CATH AND CORONARY ANGIOGRAPHY;  Surgeon: Troy Sine, MD;  Location: [place] [facility];  Service: Cardiovascular;  Laterality: N/A;   TOTAL ABDOMINAL HYSTERECTOMY  1979     Allergies  Allergen Reactions   Lisinopril Cough   Vioxx [Rofecoxib] Other (See Comments)    Epigastric discomfort       Family History  Problem Relation Age of Onset   CVA Mother    Brain cancer Father      Social History Ms. Read reports that she quit smoking about 24 years ago. Her smoking use included cigarettes. She has never used smokeless tobacco. Ms. Jagoe reports that she does not currently use alcohol.   Review of Systems CONSTITUTIONAL: No weight loss, fever, chills, weakness or fatigue.  HEENT: Eyes:  No visual loss, blurred vision, double vision or yellow sclerae.No hearing loss, sneezing, congestion, runny nose or sore throat.  SKIN: No rash or itching.  CARDIOVASCULAR: per hpi RESPIRATORY: No shortness of breath, cough or sputum.  GASTROINTESTINAL: No anorexia, nausea, vomiting or diarrhea. No abdominal pain or blood.  GENITOURINARY: No burning on urination, no polyuria NEUROLOGICAL: No headache, dizziness, syncope, paralysis, ataxia, numbness or tingling in the extremities. No change in bowel or bladder control.  MUSCULOSKELETAL: No muscle, back pain, joint pain or stiffness.  LYMPHATICS: No enlarged nodes. No history of splenectomy.  PSYCHIATRIC: No history of depression or anxiety.  ENDOCRINOLOGIC: No reports of sweating, cold or heat intolerance. No polyuria or polydipsia.  Marland Kitchen   Physical Examination Today's Vitals   10/07/22 0817  BP: 118/72  Pulse: 66  SpO2: 97%  Weight: 137 lb 12.8 oz (62.5 kg)  Height: '5\' 4"'$  (1.626 m)   Body mass index is 23.65 kg/m.  Gen: resting comfortably, no acute distress HEENT: no scleral icterus, pupils equal round and reactive, no palptable cervical adenopathy,  CV: RRR, no m/rg, no jvd Resp: Clear to auscultation bilaterally GI: abdomen is soft, non-tender, non-distended, normal bowel sounds, no hepatosplenomegaly MSK: extremities are warm, no edema.  Skin: warm, no rash Neuro:  no focal deficits Psych: appropriate affect   Diagnostic Studies 12/2017 cath Dist RCA lesion is 55% stenosed. Mid LM to Dist LM lesion is 20% stenosed. Ost Cx lesion is 20% stenosed. Prox LAD lesion is 30% stenosed. Prox RCA lesion is 20% stenosed. Mid RCA lesion is 30% stenosed.   There is evidence for diffuse coronary calcification involving all coronary arteries.   Left main stenosis with distal 20% narrowing; proximal irregularity of the LAD with 30% proximal stenosis; 20% ostial stenosis of the left circumflex vessel; and very large dominant RCA with  mild to moderate calcification and irregularity with 20% proximal 30% mid and 50 to 60% smooth stenosis immediately proximal to the takeoff of a large PDA and PLA vessel with 20% PLA narrowing.   RECOMMENDATION: Guideline directed medical therapy for the patient's cardiomyopathy which most likely is of nonischemic etiology.     Jan 2020 echo Study Conclusions   - Procedure narrative: Transthoracic echocardiography. Image   quality was fair. The study was technically difficult, as a   result of poor acoustic windows and body habitus. - Left ventricle: The cavity size was normal. Wall thickness was   normal. Systolic function was severely reduced. The estimated   ejection fraction was in the range of 20% to 25%. Diffuse   hypokinesis. Doppler parameters are consistent  with abnormal left   ventricular relaxation (grade 1 diastolic dysfunction). - Ventricular septum: Septal motion showed abnormal function and   dyssynergy. These changes are consistent with a left bundle   Blayn Whetsell block. - Mitral valve: Mildly to moderately calcified annulus. There was   mild regurgitation. - Left atrium: The atrium was mildly dilated. - Atrial septum: No defect or patent foramen ovale was identified.   03/2022 nuclear stress  Findings are consistent with ischemia. The study is low risk.   No ST deviation was noted.   LV perfusion is abnormal. There is evidence of ischemia. Defect 1: There is a small defect with mild reduction in uptake present in the mid to basal inferolateral location(s) that is reversible. There is normal wall motion in the defect area. Consistent with ischemia.   Left ventricular function is normal. End diastolic cavity size is normal. End systolic cavity size is normal.   Reversible perfusion defect in basal to mid inferolateral wall consistent with ischemia Low risk study.  Small area of ischemia  Assessment and Plan   1 HFimpEF - LVEF has normalized - no recent symptoms,  continue current meds   2. PAF/acquired thrombophilia -no symptoms, continue current meds including eliquis for stroke prevention   3. CAD --recent low risk stress test, perhaps very mild inferolateral ischemia. Symptoms resolved with imdur - continue current meds   4. Hyperlipidemia -LDL trending down, upcoming labs with pcp. Goal LDL would be <70, will see how repeat labs look.    F/u  32month  JArnoldo Lenis M.D.

## 2022-10-11 NOTE — Progress Notes (Signed)
Remote ICD transmission.   

## 2022-10-15 DIAGNOSIS — Z13820 Encounter for screening for osteoporosis: Secondary | ICD-10-CM | POA: Diagnosis not present

## 2022-10-17 ENCOUNTER — Other Ambulatory Visit: Payer: Self-pay | Admitting: Cardiology

## 2022-10-18 ENCOUNTER — Ambulatory Visit: Payer: Medicare Other | Attending: Cardiovascular Disease

## 2022-10-18 DIAGNOSIS — Z95810 Presence of automatic (implantable) cardiac defibrillator: Secondary | ICD-10-CM

## 2022-10-18 DIAGNOSIS — I5022 Chronic systolic (congestive) heart failure: Secondary | ICD-10-CM

## 2022-10-19 ENCOUNTER — Telehealth: Payer: Self-pay

## 2022-10-19 DIAGNOSIS — Z299 Encounter for prophylactic measures, unspecified: Secondary | ICD-10-CM | POA: Diagnosis not present

## 2022-10-19 DIAGNOSIS — L03011 Cellulitis of right finger: Secondary | ICD-10-CM | POA: Diagnosis not present

## 2022-10-19 DIAGNOSIS — I10 Essential (primary) hypertension: Secondary | ICD-10-CM | POA: Diagnosis not present

## 2022-10-19 NOTE — Telephone Encounter (Signed)
Remote ICM transmission received.  Attempted call to patient regarding ICM remote transmission and person answering phone stated she was not home.   

## 2022-10-19 NOTE — Progress Notes (Signed)
EPIC Encounter for ICM Monitoring  Patient Name: Kathleen Edwards is a 77 y.o. female Date: 10/19/2022 Primary Care Physican: Glenda Chroman, MD Primary Cardiologist: Branch Electrophysiologist:  [name]: >99% 03/25/2022 Office Weight: 146 lbs  06/29/2022 Weight: 146 lbs   AT/AF Burden: <1% (taking Eliquis)                                                            Attempted call to patient and unable to reach.   Transmission reviewed.     CorVue thoracic impedance suggesting possible fluid accumulation starting 3/19.  Also suggesting possible fluid accumulation from 3/12-3/17.   Prescribed: Furosemide 20 mg take 1 tablet daily. Potassium 10 mEq take 1 tablet daily   Labs: 07/17/2022 Creatinine 0.83, BUN 21, Potassium 3.5, Sodium 139, GFR 73 A complete set of results can be found in Results Review.   Recommendations:  Unable to reach.      Follow-up plan: ICM clinic phone appointment on 10/25/2022 to recheck fluid levels.   91 day device clinic remote transmission 12/07/2022.    EP/Cardiology Office Visits: 04/15/2023 with Dr Harl Bowie.  Recall 06/01/2023 with Dr Myles Gip.   Copy of ICM check sent to Dr. Myles Gip.   Will send copy to Dr Harl Bowie for review if patient is reached.     3 month ICM trend: 10/18/2022.    12-14 Month ICM trend:     Rosalene Billings, RN 10/19/2022 1:40 PM

## 2022-10-25 ENCOUNTER — Ambulatory Visit: Payer: Medicare Other | Attending: Cardiovascular Disease

## 2022-10-25 DIAGNOSIS — Z95810 Presence of automatic (implantable) cardiac defibrillator: Secondary | ICD-10-CM

## 2022-10-25 DIAGNOSIS — I5020 Unspecified systolic (congestive) heart failure: Secondary | ICD-10-CM

## 2022-10-25 DIAGNOSIS — I5032 Chronic diastolic (congestive) heart failure: Secondary | ICD-10-CM

## 2022-10-26 DIAGNOSIS — L03011 Cellulitis of right finger: Secondary | ICD-10-CM | POA: Diagnosis not present

## 2022-10-26 DIAGNOSIS — I5020 Unspecified systolic (congestive) heart failure: Secondary | ICD-10-CM | POA: Diagnosis not present

## 2022-10-26 DIAGNOSIS — Z299 Encounter for prophylactic measures, unspecified: Secondary | ICD-10-CM | POA: Diagnosis not present

## 2022-10-26 DIAGNOSIS — I10 Essential (primary) hypertension: Secondary | ICD-10-CM | POA: Diagnosis not present

## 2022-10-26 NOTE — Progress Notes (Signed)
EPIC Encounter for ICM Monitoring  Patient Name: Kathleen Edwards is a 77 y.o. female Date: 10/26/2022 Primary Care Physican: Glenda Chroman, MD Primary Cardiologist: Branch Electrophysiologist:  [name]: 99% 03/25/2022 Office Weight: 146 lbs  06/29/2022 Weight: 146 lbs   AT/AF Burden: <1% (taking Eliquis)                                                            Transmission reviewed.     CorVue thoracic impedance suggesting fluid levels returned to normal.   Prescribed: Furosemide 20 mg take 1 tablet daily. Potassium 10 mEq take 1 tablet daily   Labs: 07/17/2022 Creatinine 0.83, BUN 21, Potassium 3.5, Sodium 139, GFR 73 A complete set of results can be found in Results Review.   Recommendations:  No changes.    Follow-up plan: ICM clinic phone appointment on 11/22/2022.   91 day device clinic remote transmission 12/07/2022.    EP/Cardiology Office Visits: 04/15/2023 with Dr Harl Bowie.  Recall 06/01/2023 with Dr Myles Gip.   Copy of ICM check sent to Dr. Myles Gip.   3 month ICM trend: 10/25/2022.    12-14 Month ICM trend:     Rosalene Billings, RN 10/26/2022 3:15 PM

## 2022-11-22 ENCOUNTER — Ambulatory Visit: Payer: Medicare Other | Attending: Cardiovascular Disease

## 2022-11-22 DIAGNOSIS — Z95810 Presence of automatic (implantable) cardiac defibrillator: Secondary | ICD-10-CM

## 2022-11-22 DIAGNOSIS — I5032 Chronic diastolic (congestive) heart failure: Secondary | ICD-10-CM | POA: Diagnosis not present

## 2022-11-26 NOTE — Progress Notes (Signed)
EPIC Encounter for ICM Monitoring  Patient Name: Kathleen Edwards is a 77 y.o. female Date: 11/26/2022 Primary Care Physican: Ignatius Specking, MD Primary Cardiologist: Branch Electrophysiologist:  [name]: 99% 03/25/2022 Office Weight: 146 lbs  06/29/2022 Weight: 146 lbs   AT/AF Burden: <1% (taking Eliquis)                                                            Transmission reviewed.     CorVue thoracic impedance suggesting intermittent days with possible fluid accumulation since 4/1.   Prescribed: Furosemide 20 mg take 1 tablet daily. Potassium 10 mEq take 1 tablet daily   Labs: 07/17/2022 Creatinine 0.83, BUN 21, Potassium 3.5, Sodium 139, GFR 73 A complete set of results can be found in Results Review.   Recommendations:  No changes.    Follow-up plan: ICM clinic phone appointment on 12/27/2022.   91 day device clinic remote transmission 03/08/2023.    EP/Cardiology Office Visits: 04/15/2023 with Dr Wyline Mood.  Recall 06/01/2023 with Dr Nelly Laurence.   Copy of ICM check sent to Dr. Nelly Laurence.   3 month ICM trend: 11/22/2022.    12-14 Month ICM trend:     Karie Soda, RN 11/26/2022 4:43 PM

## 2022-12-07 ENCOUNTER — Ambulatory Visit (INDEPENDENT_AMBULATORY_CARE_PROVIDER_SITE_OTHER): Payer: Medicare Other

## 2022-12-07 DIAGNOSIS — I428 Other cardiomyopathies: Secondary | ICD-10-CM | POA: Diagnosis not present

## 2022-12-07 LAB — CUP PACEART REMOTE DEVICE CHECK
Battery Remaining Longevity: 42 mo
Battery Remaining Percentage: 46 %
Battery Voltage: 2.95 V
Brady Statistic AP VP Percent: 17 %
Brady Statistic AP VS Percent: 1 %
Brady Statistic AS VP Percent: 82 %
Brady Statistic AS VS Percent: 1 %
Brady Statistic RA Percent Paced: 17 %
Date Time Interrogation Session: 20240514020017
HighPow Impedance: 71 Ohm
HighPow Impedance: 71 Ohm
Implantable Lead Connection Status: 753985
Implantable Lead Connection Status: 753985
Implantable Lead Connection Status: 753985
Implantable Lead Implant Date: 20200218
Implantable Lead Implant Date: 20200218
Implantable Lead Implant Date: 20200218
Implantable Lead Location: 753858
Implantable Lead Location: 753859
Implantable Lead Location: 753860
Implantable Pulse Generator Implant Date: 20200218
Lead Channel Impedance Value: 1100 Ohm
Lead Channel Impedance Value: 410 Ohm
Lead Channel Impedance Value: 530 Ohm
Lead Channel Pacing Threshold Amplitude: 0.625 V
Lead Channel Pacing Threshold Amplitude: 0.875 V
Lead Channel Pacing Threshold Amplitude: 2 V
Lead Channel Pacing Threshold Pulse Width: 0.4 ms
Lead Channel Pacing Threshold Pulse Width: 0.5 ms
Lead Channel Pacing Threshold Pulse Width: 0.7 ms
Lead Channel Sensing Intrinsic Amplitude: 0.9 mV
Lead Channel Sensing Intrinsic Amplitude: 7 mV
Lead Channel Setting Pacing Amplitude: 1.875
Lead Channel Setting Pacing Amplitude: 2 V
Lead Channel Setting Pacing Amplitude: 2.5 V
Lead Channel Setting Pacing Pulse Width: 0.4 ms
Lead Channel Setting Pacing Pulse Width: 0.7 ms
Lead Channel Setting Sensing Sensitivity: 0.5 mV
Pulse Gen Serial Number: 9880584
Zone Setting Status: 755011

## 2022-12-27 ENCOUNTER — Ambulatory Visit: Payer: Medicare Other | Attending: Cardiovascular Disease

## 2022-12-27 DIAGNOSIS — Z95810 Presence of automatic (implantable) cardiac defibrillator: Secondary | ICD-10-CM

## 2022-12-27 DIAGNOSIS — I5032 Chronic diastolic (congestive) heart failure: Secondary | ICD-10-CM | POA: Diagnosis not present

## 2022-12-30 ENCOUNTER — Telehealth: Payer: Self-pay

## 2022-12-30 NOTE — Progress Notes (Signed)
EPIC Encounter for ICM Monitoring  Patient Name: Kathleen Edwards is a 77 y.o. female Date: 12/30/2022 Primary Care Physican: Ignatius Specking, MD Primary Cardiologist: Branch Electrophysiologist:  [name]: 99% 03/25/2022 Office Weight: 146 lbs  06/29/2022 Weight: 146 lbs   AT/AF Burden: <1% (taking Eliquis)                                                            Attempted call to patient and unable to reach.   Transmission reviewed.     CorVue thoracic impedance suggesting normal fluid levels with the exception of possible fluid accumulation from 5/4-5/9 and 5/21-5/25.   Prescribed: Furosemide 20 mg take 1 tablet daily. Potassium 10 mEq take 1 tablet daily   Labs: 07/17/2022 Creatinine 0.83, BUN 21, Potassium 3.5, Sodium 139, GFR 73 A complete set of results can be found in Results Review.   Recommendations:  Unable to reach.     Follow-up plan: ICM clinic phone appointment on 01/31/2023.   91 day device clinic remote transmission 03/08/2023.    EP/Cardiology Office Visits: 04/15/2023 with Dr Wyline Mood.  Recall 06/01/2023 with Dr Nelly Laurence.   Copy of ICM check sent to Dr. Nelly Laurence.   3 month ICM trend: 12/27/2022.    12-14 Month ICM trend:     Karie Soda, RN 12/30/2022 1:30 PM

## 2022-12-30 NOTE — Telephone Encounter (Signed)
Remote ICM transmission received.  Attempted call to patient regarding ICM remote transmission and person answering phone stated she was at work.

## 2023-01-04 NOTE — Progress Notes (Signed)
Remote ICD transmission.   

## 2023-01-26 DIAGNOSIS — E559 Vitamin D deficiency, unspecified: Secondary | ICD-10-CM | POA: Diagnosis not present

## 2023-01-26 DIAGNOSIS — Z79899 Other long term (current) drug therapy: Secondary | ICD-10-CM | POA: Diagnosis not present

## 2023-01-26 DIAGNOSIS — M8580 Other specified disorders of bone density and structure, unspecified site: Secondary | ICD-10-CM | POA: Diagnosis not present

## 2023-01-26 DIAGNOSIS — I10 Essential (primary) hypertension: Secondary | ICD-10-CM | POA: Diagnosis not present

## 2023-01-26 DIAGNOSIS — E7800 Pure hypercholesterolemia, unspecified: Secondary | ICD-10-CM | POA: Diagnosis not present

## 2023-01-26 DIAGNOSIS — R5383 Other fatigue: Secondary | ICD-10-CM | POA: Diagnosis not present

## 2023-01-26 DIAGNOSIS — Z299 Encounter for prophylactic measures, unspecified: Secondary | ICD-10-CM | POA: Diagnosis not present

## 2023-01-26 DIAGNOSIS — Z0000 Encounter for general adult medical examination without abnormal findings: Secondary | ICD-10-CM | POA: Diagnosis not present

## 2023-01-31 ENCOUNTER — Ambulatory Visit: Payer: Medicare Other | Attending: Cardiovascular Disease

## 2023-01-31 DIAGNOSIS — Z95810 Presence of automatic (implantable) cardiac defibrillator: Secondary | ICD-10-CM | POA: Diagnosis not present

## 2023-01-31 DIAGNOSIS — I5032 Chronic diastolic (congestive) heart failure: Secondary | ICD-10-CM | POA: Diagnosis not present

## 2023-02-02 NOTE — Progress Notes (Signed)
EPIC Encounter for ICM Monitoring  Patient Name: Kathleen Edwards is a 77 y.o. female Date: 02/02/2023 Primary Care Physican: Ignatius Specking, MD Primary Cardiologist: Branch Electrophysiologist:  [name]: 99% 02/02/2023 Weight: 142 lbs   AT/AF Burden: <1% (taking Eliquis)                                                            Spoke with patient and heart failure questions reviewed.  Transmission results reviewed.  Pt asymptomatic for fluid accumulation.  Reports feeling well at this time and voices no complaints.      CorVue thoracic impedance suggesting possible fluid accumulation starting 7/5.   Prescribed: Furosemide 20 mg take 1 tablet daily. Potassium 10 mEq take 1 tablet daily   Labs: 07/17/2022 Creatinine 0.83, BUN 21, Potassium 3.5, Sodium 139, GFR 73 A complete set of results can be found in Results Review.   Recommendations:  Advised to take Furosemide 2 tablets = 40 mg + 2 tablets Potassium = 20 mEq x 1 day and she agreed.     Follow-up plan: ICM clinic phone appointment on 02/07/2023 to recheck fluid levels.   91 day device clinic remote transmission 03/08/2023.    EP/Cardiology Office Visits: 04/15/2023 with Dr Wyline Mood.  Recall 06/01/2023 with Dr Nelly Laurence.   Copy of ICM check sent to Dr. Nelly Laurence and Dr Wyline Mood as Lorain Childes.  3 month ICM trend: 01/31/2023.    12-14 Month ICM trend:     Karie Soda, RN 02/02/2023 8:35 AM

## 2023-02-07 ENCOUNTER — Ambulatory Visit: Payer: Medicare Other | Attending: Cardiovascular Disease

## 2023-02-07 DIAGNOSIS — Z95810 Presence of automatic (implantable) cardiac defibrillator: Secondary | ICD-10-CM

## 2023-02-07 DIAGNOSIS — I5032 Chronic diastolic (congestive) heart failure: Secondary | ICD-10-CM

## 2023-02-09 NOTE — Progress Notes (Signed)
EPIC Encounter for ICM Monitoring  Patient Name: Kathleen Edwards is a 77 y.o. female Date: 02/09/2023 Primary Care Physican: Ignatius Specking, MD Primary Cardiologist: Branch Electrophysiologist:  [name]: 99% 02/02/2023 Weight: 142 lbs   AT/AF Burden: <1% (taking Eliquis)                                                            Spoke with patient and heart failure questions reviewed.  Transmission results reviewed.  Pt asymptomatic for fluid accumulation.  Reports feeling well at this time and voices no complaints.      CorVue thoracic impedance suggesting fluid levels returned to normal after taking extra Furosemide x 2 days.   Prescribed: Furosemide 20 mg take 1 tablet daily. Potassium 10 mEq take 1 tablet daily   Labs: 07/17/2022 Creatinine 0.83, BUN 21, Potassium 3.5, Sodium 139, GFR 73 A complete set of results can be found in Results Review.   Recommendations:  No changes and encouraged to call if experiencing any fluid symptoms.   Follow-up plan: ICM clinic phone appointment on 03/09/2023.   91 day device clinic remote transmission 03/08/2023.    EP/Cardiology Office Visits: 04/15/2023 with Dr Wyline Mood.  Recall 06/01/2023 with Dr Nelly Laurence.   Copy of ICM check sent to Dr. Nelly Laurence.  3 month ICM trend: 02/07/2023.    12-14 Month ICM trend:     Karie Soda, RN 02/09/2023 1:12 PM

## 2023-03-08 ENCOUNTER — Ambulatory Visit (INDEPENDENT_AMBULATORY_CARE_PROVIDER_SITE_OTHER): Payer: Medicare Other

## 2023-03-08 DIAGNOSIS — I428 Other cardiomyopathies: Secondary | ICD-10-CM | POA: Diagnosis not present

## 2023-03-09 ENCOUNTER — Ambulatory Visit: Payer: Medicare Other | Attending: Cardiovascular Disease

## 2023-03-09 DIAGNOSIS — I5022 Chronic systolic (congestive) heart failure: Secondary | ICD-10-CM

## 2023-03-09 DIAGNOSIS — Z95810 Presence of automatic (implantable) cardiac defibrillator: Secondary | ICD-10-CM

## 2023-03-11 ENCOUNTER — Other Ambulatory Visit: Payer: Self-pay | Admitting: Cardiology

## 2023-03-14 NOTE — Progress Notes (Signed)
EPIC Encounter for ICM Monitoring  Patient Name: Kathleen Edwards is a 77 y.o. female Date: 03/14/2023 Primary Care Physican: Ignatius Specking, MD Primary Cardiologist: Branch Electrophysiologist:  [name]: 99% 02/02/2023 Weight: 142 lbs   AT/AF Burden: <1% (taking Eliquis)                                                            Transmission results reviewed.     CorVue thoracic impedance suggesting intermittent days with possible fluid accumulation within the last month.   Prescribed: Furosemide 20 mg take 1 tablet daily. Potassium 10 mEq take 1 tablet daily   Labs: 07/17/2022 Creatinine 0.83, BUN 21, Potassium 3.5, Sodium 139, GFR 73 A complete set of results can be found in Results Review.   Recommendations:  No changes.   Follow-up plan: ICM clinic phone appointment on 04/11/2023.   91 day device clinic remote transmission 06/07/2023.    EP/Cardiology Office Visits: 04/15/2023 with Dr Wyline Mood.  Recall 06/01/2023 with Dr Nelly Laurence.   Copy of ICM check sent to Dr. Nelly Laurence.  3 month ICM trend: 03/09/2023.    12-14 Month ICM trend:     Karie Soda, RN 03/14/2023 12:07 PM

## 2023-03-23 NOTE — Progress Notes (Signed)
Remote ICD transmission.   

## 2023-04-11 ENCOUNTER — Ambulatory Visit: Payer: Medicare Other | Attending: Cardiovascular Disease

## 2023-04-11 DIAGNOSIS — I5022 Chronic systolic (congestive) heart failure: Secondary | ICD-10-CM | POA: Diagnosis not present

## 2023-04-11 DIAGNOSIS — Z95810 Presence of automatic (implantable) cardiac defibrillator: Secondary | ICD-10-CM

## 2023-04-13 NOTE — Progress Notes (Signed)
EPIC Encounter for ICM Monitoring  Patient Name: Kathleen Edwards is a 77 y.o. female Date: 04/13/2023 Primary Care Physican: Ignatius Specking, MD Primary Cardiologist: Branch Electrophysiologist:  [name]: 99% 02/02/2023 Weight: 142 lbs   AT/AF Burden: <1% (taking Eliquis)                                                            Transmission results reviewed.     CorVue thoracic impedance suggesting normal fluid levels with the exception of possible fluid accumulation from 8/26-8/29 and 9/3-9/6.   Prescribed: Furosemide 20 mg take 1 tablet daily. Potassium 10 mEq take 1 tablet daily   Labs: 07/17/2022 Creatinine 0.83, BUN 21, Potassium 3.5, Sodium 139, GFR 73 A complete set of results can be found in Results Review.   Recommendations:  No changes.   Follow-up plan: ICM clinic phone appointment on 05/16/2023.   91 day device clinic remote transmission 06/07/2023.    EP/Cardiology Office Visits: 04/15/2023 with Dr Wyline Mood.  Recall 06/01/2023 with Dr Nelly Laurence.   Copy of ICM check sent to Dr. Nelly Laurence and Dr Wyline Mood as Lorain Childes for 04/15/2023 OV.  3 month ICM trend: 04/11/2023.    12-14 Month ICM trend:     Karie Soda, RN 04/13/2023 12:52 PM

## 2023-04-15 ENCOUNTER — Encounter: Payer: Self-pay | Admitting: Cardiology

## 2023-04-15 ENCOUNTER — Ambulatory Visit: Payer: Medicare Other | Attending: Cardiology | Admitting: Cardiology

## 2023-04-15 VITALS — BP 106/52 | HR 81 | Ht 64.0 in | Wt 140.4 lb

## 2023-04-15 DIAGNOSIS — I5032 Chronic diastolic (congestive) heart failure: Secondary | ICD-10-CM | POA: Diagnosis not present

## 2023-04-15 DIAGNOSIS — E782 Mixed hyperlipidemia: Secondary | ICD-10-CM

## 2023-04-15 DIAGNOSIS — I2510 Atherosclerotic heart disease of native coronary artery without angina pectoris: Secondary | ICD-10-CM

## 2023-04-15 DIAGNOSIS — I480 Paroxysmal atrial fibrillation: Secondary | ICD-10-CM | POA: Diagnosis not present

## 2023-04-15 MED ORDER — ATORVASTATIN CALCIUM 40 MG PO TABS: 40.0000 mg | ORAL_TABLET | Freq: Every day | ORAL | 6 refills | Status: DC

## 2023-04-15 NOTE — Patient Instructions (Addendum)
Medication Instructions:   Stop Pravastatin (Pravachol) Begin Atorvastatin (Lipitor) 40mg  daily  Continue all other medications.     Labwork:  none  Testing/Procedures:  none  Follow-Up:  6 months   Any Other Special Instructions Will Be Listed Below (If Applicable).   If you need a refill on your cardiac medications before your next appointment, please call your pharmacy.

## 2023-04-15 NOTE — Progress Notes (Signed)
Clinical Summary Kathleen Edwards is a 77 y.o.female seen today for follow up fo the following medical problems.    1. HFimpEF - admitted 01/17/18 with new diagnosis of systolic HF.  - from notes presented with hypertensive emergency, started on nitro gtt and diuresed - EKG LBBB,  - 12/2017 echo [facility]: LVEF 20%  - 12/2017 cath with mild to moderate CAD. CI 3.2, PCWP 10, LVEDP 14. Overall normal CO and filling pressures   Jan 2020 echo: LVEF 20-25%.    08/2018 BiV AICD placed, followed by EP 03/2019 echo: LVEF 55-60%,  - 08/2022 normal device check    - no SOB/DOE, no recent edema - compliant with med - 02/2023 normal device function       2. PAF -  noted device checks 11/2018 and 02/2019 - CHADS2Vasc score is 5 (CHF, HTN, age x1, CAD,gender)   - denies any palpitations - no bleeding on eliquis   3. CAD - 12/2017 cath as reported below, overall mild to moderate disease - occasional tightness midchest, 2-3/10 in severity. Can occur at rest or with exertion but more commonly with exertion. No orther associated symtpoms. Lasts about 5 minutes, few times a month.    03/2022 nuclear stress: inferolateral iscehmia, mild and low risk - started on imdur 15 mg daily  - no chest pains.   4. Hyperlipidmeia -upcoming labs with pcp next month - 08/2021 TC 197 HDL 70 TG 80 LDL 109. 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   Previously retired at [employer].   Past Medical History:  Diagnosis Date   Acquired absence of both cervix and uterus    Anxiety disorder    Asthma    Degenerative joint disease of hand    Diverticulosis    Fatigue    H/O: osteoarthritis    History of vitamin A deficiency    Hyperlipidemia    Hypertension    Keratosis, seborrheic    Lipoma    Ovarian failure    Rhinitis, allergic    Vitamin D  deficiency      Allergies  Allergen Reactions   Lisinopril Cough   Vioxx [Rofecoxib] Other (See Comments)    Epigastric discomfort      Current Outpatient Medications  Medication Sig Dispense Refill   amoxicillin-clavulanate (AUGMENTIN) 500-125 MG tablet Take 1 tablet by mouth 2 (two) times daily.     Cholecalciferol (VITAMIN D PO) Take 1 capsule by mouth daily.     ELIQUIS 5 MG TABS tablet TAKE 1 TABLET BY MOUTH TWICE DAILY 60 tablet 5   famotidine (PEPCID) 20 MG tablet Take by mouth.     furosemide (LASIX) 20 MG tablet TAKE 1 TABLET BY MOUTH DAILY 90 tablet 3   isosorbide mononitrate (IMDUR) 30 MG 24 hr tablet TAKE 1/2 TABLET BY MOUTH DAILY 45 tablet 3   metoprolol succinate (TOPROL-XL) 25 MG 24 hr tablet TAKE 1 AND 1/2 TABLETS BY MOUTH DAILY 135 tablet 3   potassium chloride (K-DUR) 10 MEQ tablet Take 10 mEq by mouth daily.     pravastatin (PRAVACHOL) 80 MG tablet TAKE 1 TABLET BY MOUTH EVERY DAY 90 tablet 1   sacubitril-valsartan (ENTRESTO) 97-103 MG TAKE 1 TABLET BY MOUTH TWICE DAILY 180 tablet 1  No current facility-administered medications for this visit.     Past Surgical History:  Procedure Laterality Date   BIV ICD INSERTION CRT-D N/A 09/12/2018   Procedure: BIV ICD INSERTION CRT-D;  Surgeon: Hillis Range, MD;  Location: [facility] INVASIVE CV LAB;  Service: Cardiovascular;  Laterality: N/A;   RIGHT/LEFT HEART CATH AND CORONARY ANGIOGRAPHY N/A 02/15/2018   Procedure: RIGHT/LEFT HEART CATH AND CORONARY ANGIOGRAPHY;  Surgeon: Lennette Bihari, MD;  Location: [facility] INVASIVE CV LAB;  Service: Cardiovascular;  Laterality: N/A;   TOTAL ABDOMINAL HYSTERECTOMY  1979     Allergies  Allergen Reactions   Lisinopril Cough   Vioxx [Rofecoxib] Other (See Comments)    Epigastric discomfort       Family History  Problem Relation Age of Onset   CVA Mother    Brain cancer Father      Social History Kathleen Edwards reports that she quit smoking about 24 years ago. Her smoking use  included cigarettes. She has never used smokeless tobacco. Kathleen Edwards reports that she does not currently use alcohol.   Review of Systems CONSTITUTIONAL: No weight loss, fever, chills, weakness or fatigue.  HEENT: Eyes: No visual loss, blurred vision, double vision or yellow sclerae.No hearing loss, sneezing, congestion, runny nose or sore throat.  SKIN: No rash or itching.  CARDIOVASCULAR: per hpi RESPIRATORY: No shortness of breath, cough or sputum.  GASTROINTESTINAL: No anorexia, nausea, vomiting or diarrhea. No abdominal pain or blood.  GENITOURINARY: No burning on urination, no polyuria NEUROLOGICAL: No headache, dizziness, syncope, paralysis, ataxia, numbness or tingling in the extremities. No change in bowel or bladder control.  MUSCULOSKELETAL: No muscle, back pain, joint pain or stiffness.  LYMPHATICS: No enlarged nodes. No history of splenectomy.  PSYCHIATRIC: No history of depression or anxiety.  ENDOCRINOLOGIC: No reports of sweating, cold or heat intolerance. No polyuria or polydipsia.  Marland Kitchen   Physical Examination Today's Vitals   04/15/23 0800  BP: (!) 106/52  Pulse: 81  SpO2: 99%  Weight: 140 lb 6.4 oz (63.7 kg)  Height: 5\' 4"  (1.626 m)   Body mass index is 24.1 kg/m.  Gen: resting comfortably, no acute distress HEENT: no scleral icterus, pupils equal round and reactive, no palptable cervical adenopathy,  CV: RRR, no m/rg,no jvd Resp: Clear to auscultation bilaterally GI: abdomen is soft, non-tender, non-distended, normal bowel sounds, no hepatosplenomegaly MSK: extremities are warm, no edema.  Skin: warm, no rash Neuro:  no focal deficits Psych: appropriate affect   Diagnostic Studies  12/2017 cath Dist RCA lesion is 55% stenosed. Mid LM to Dist LM lesion is 20% stenosed. Ost Cx lesion is 20% stenosed. Prox LAD lesion is 30% stenosed. Prox RCA lesion is 20% stenosed. Mid RCA lesion is 30% stenosed.   There is evidence for diffuse coronary  calcification involving all coronary arteries.   Left main stenosis with distal 20% narrowing; proximal irregularity of the LAD with 30% proximal stenosis; 20% ostial stenosis of the left circumflex vessel; and very large dominant RCA with mild to moderate calcification and irregularity with 20% proximal 30% mid and 50 to 60% smooth stenosis immediately proximal to the takeoff of a large PDA and PLA vessel with 20% PLA narrowing.   RECOMMENDATION: Guideline directed medical therapy for the patient's cardiomyopathy which most likely is of nonischemic etiology.     Jan 2020 echo Study Conclusions   - Procedure narrative: Transthoracic echocardiography. Image   quality was fair. The study was technically difficult, as a   result of poor  acoustic windows and body habitus. - Left ventricle: The cavity size was normal. Wall thickness was   normal. Systolic function was severely reduced. The estimated   ejection fraction was in the range of 20% to 25%. Diffuse   hypokinesis. Doppler parameters are consistent with abnormal left   ventricular relaxation (grade 1 diastolic dysfunction). - Ventricular septum: Septal motion showed abnormal function and   dyssynergy. These changes are consistent with a left bundle   Kathleen Edwards block. - Mitral valve: Mildly to moderately calcified annulus. There was   mild regurgitation. - Left atrium: The atrium was mildly dilated. - Atrial septum: No defect or patent foramen ovale was identified.   03/2022 nuclear stress   Findings are consistent with ischemia. The study is low risk.   No ST deviation was noted.   LV perfusion is abnormal. There is evidence of ischemia. Defect 1: There is a small defect with mild reduction in uptake present in the mid to basal inferolateral location(s) that is reversible. There is normal wall motion in the defect area. Consistent with ischemia.   Left ventricular function is normal. End diastolic cavity size is normal. End systolic  cavity size is normal.   Reversible perfusion defect in basal to mid inferolateral wall consistent with ischemia Low risk study.  Small area of ischemia   Assessment and Plan   1 HFimpEF - LVEF has normalized - she denies any symptoms, continue current meds   2. PAF/acquired thrombophilia No recent symptoms, continue current meds, She is on eliquis 5mg  bid for stroke prevention   3. CAD --recent low risk stress test, perhaps very mild inferolateral ischemia. Prior symptoms resolved with imdur - continues to do well without rececnt symptoms, continue current meds   4. Hyperlipidemia -LDL is above goal, change pravastatin 80mg  to atorvastatin 40mg  daily  F/u 6 months  Antoine Poche, M.D

## 2023-05-10 ENCOUNTER — Other Ambulatory Visit: Payer: Self-pay | Admitting: Cardiology

## 2023-05-16 ENCOUNTER — Ambulatory Visit: Payer: Medicare Other | Attending: Cardiovascular Disease

## 2023-05-16 DIAGNOSIS — I5032 Chronic diastolic (congestive) heart failure: Secondary | ICD-10-CM

## 2023-05-16 DIAGNOSIS — I5020 Unspecified systolic (congestive) heart failure: Secondary | ICD-10-CM

## 2023-05-16 DIAGNOSIS — Z95810 Presence of automatic (implantable) cardiac defibrillator: Secondary | ICD-10-CM | POA: Diagnosis not present

## 2023-05-19 NOTE — Progress Notes (Signed)
EPIC Encounter for ICM Monitoring  Patient Name: Kathleen Edwards is a 77 y.o. female Date: 05/19/2023 Primary Care Physican: Ignatius Specking, MD Primary Cardiologist: Branch Electrophysiologist:  [name]: 99% 02/02/2023 Weight: 142 lbs   AT/AF Burden: <1% (taking Eliquis)                                                            Transmission results reviewed.     CorVue thoracic impedance suggesting normal fluid levels within the last month.   Prescribed: Furosemide 20 mg take 1 tablet daily. Potassium 10 mEq take 1 tablet daily   Labs: 07/17/2022 Creatinine 0.83, BUN 21, Potassium 3.5, Sodium 139, GFR 73 A complete set of results can be found in Results Review.   Recommendations:  No changes.   Follow-up plan: ICM clinic phone appointment on 06/27/2023.   91 day device clinic remote transmission 06/07/2023.    EP/Cardiology Office Visits: 10/21/2023 with Dr Wyline Mood.  Recall 06/01/2023 with Dr Nelly Laurence.   Copy of ICM check sent to Dr. Nelly Laurence.  3 month ICM trend: 05/16/2023.    12-14 Month ICM trend:     Karie Soda, RN 05/19/2023 4:14 PM

## 2023-05-22 ENCOUNTER — Other Ambulatory Visit: Payer: Self-pay | Admitting: Cardiology

## 2023-05-22 DIAGNOSIS — I480 Paroxysmal atrial fibrillation: Secondary | ICD-10-CM

## 2023-05-23 ENCOUNTER — Other Ambulatory Visit: Payer: Self-pay | Admitting: Cardiology

## 2023-05-24 NOTE — Telephone Encounter (Signed)
Prescription refill request for Eliquis received. Indication: a fib Last office visit: 04/15/23 Scr: 0.78 [facility] 01/26/23 Age: 77 Weight: 63kg

## 2023-06-07 ENCOUNTER — Ambulatory Visit (INDEPENDENT_AMBULATORY_CARE_PROVIDER_SITE_OTHER): Payer: Medicare Other

## 2023-06-07 DIAGNOSIS — I428 Other cardiomyopathies: Secondary | ICD-10-CM

## 2023-06-08 LAB — CUP PACEART REMOTE DEVICE CHECK
Battery Remaining Longevity: 37 mo
Battery Remaining Percentage: 40 %
Battery Voltage: 2.93 V
Brady Statistic AP VP Percent: 16 %
Brady Statistic AP VS Percent: 1 %
Brady Statistic AS VP Percent: 83 %
Brady Statistic AS VS Percent: 1 %
Brady Statistic RA Percent Paced: 16 %
Date Time Interrogation Session: 20241112020016
HighPow Impedance: 64 Ohm
HighPow Impedance: 64 Ohm
Lead Channel Impedance Value: 1075 Ohm
Lead Channel Impedance Value: 390 Ohm
Lead Channel Impedance Value: 460 Ohm
Lead Channel Pacing Threshold Amplitude: 0.625 V
Lead Channel Pacing Threshold Amplitude: 1.125 V
Lead Channel Pacing Threshold Amplitude: 1.875 V
Lead Channel Pacing Threshold Pulse Width: 0.4 ms
Lead Channel Pacing Threshold Pulse Width: 0.5 ms
Lead Channel Pacing Threshold Pulse Width: 0.7 ms
Lead Channel Sensing Intrinsic Amplitude: 1.3 mV
Lead Channel Sensing Intrinsic Amplitude: 7.3 mV
Lead Channel Setting Pacing Amplitude: 2 V
Lead Channel Setting Pacing Amplitude: 2.125
Lead Channel Setting Pacing Amplitude: 2.375
Lead Channel Setting Pacing Pulse Width: 0.4 ms
Lead Channel Setting Pacing Pulse Width: 0.7 ms
Lead Channel Setting Sensing Sensitivity: 0.5 mV
Pulse Gen Serial Number: 9880584
Zone Setting Status: 755011

## 2023-06-27 ENCOUNTER — Ambulatory Visit: Payer: Medicare Other | Attending: Cardiovascular Disease

## 2023-06-27 DIAGNOSIS — I5032 Chronic diastolic (congestive) heart failure: Secondary | ICD-10-CM | POA: Diagnosis not present

## 2023-06-27 DIAGNOSIS — I5020 Unspecified systolic (congestive) heart failure: Secondary | ICD-10-CM

## 2023-06-27 DIAGNOSIS — Z95810 Presence of automatic (implantable) cardiac defibrillator: Secondary | ICD-10-CM

## 2023-06-29 NOTE — Progress Notes (Signed)
EPIC Encounter for ICM Monitoring  Patient Name: Kathleen Edwards is a 77 y.o. female Date: 06/29/2023 Primary Care Physican: Ignatius Specking, MD Primary Cardiologist: Branch Electrophysiologist:  [name]: 99% 02/02/2023 Weight: 142 lbs   AT/AF Burden: <1% (taking Eliquis)                                                            Transmission results reviewed.     CorVue thoracic impedance suggesting normal fluid levels within the last month.   Prescribed: Furosemide 20 mg take 1 tablet daily. Potassium 10 mEq take 1 tablet daily   Labs: 07/17/2022 Creatinine 0.83, BUN 21, Potassium 3.5, Sodium 139, GFR 73 A complete set of results can be found in Results Review.   Recommendations:  No changes.   Follow-up plan: ICM clinic phone appointment on 08/01/2023.   91 day device clinic remote transmission 09/06/2023.    EP/Cardiology Office Visits: 10/21/2023 with Dr Wyline Mood.  Recall 06/01/2023 with Dr Nelly Laurence.   Copy of ICM check sent to Dr. Nelly Laurence.  3 month ICM trend: 06/27/2023.    12-14 Month ICM trend:     Karie Soda, RN 06/29/2023 4:09 PM

## 2023-07-05 NOTE — Progress Notes (Signed)
Remote ICD transmission.   

## 2023-07-24 ENCOUNTER — Other Ambulatory Visit: Payer: Self-pay | Admitting: Cardiology

## 2023-08-01 ENCOUNTER — Ambulatory Visit: Payer: Medicare Other | Attending: Cardiovascular Disease

## 2023-08-01 DIAGNOSIS — Z95810 Presence of automatic (implantable) cardiac defibrillator: Secondary | ICD-10-CM

## 2023-08-01 DIAGNOSIS — I5032 Chronic diastolic (congestive) heart failure: Secondary | ICD-10-CM

## 2023-08-04 NOTE — Progress Notes (Signed)
 EPIC Encounter for ICM Monitoring  Patient Name: Kathleen Edwards is a 78 y.o. female Date: 08/04/2023 Primary Care Physican: [name], MD Primary Cardiologist: Branch Electrophysiologist:  [name]: 99% 04/15/2023 Office Weight: 140 lbs   AT/AF Burden: <1% (taking Eliquis )                                                            Transmission results reviewed.     CorVue thoracic impedance suggesting normal fluid levels with the exception of possible fluid accumulation from 12/19-12/26.   Prescribed: Furosemide  20 mg take 1 tablet daily. Potassium 10 mEq take 1 tablet daily   Labs: 07/17/2022 Creatinine 0.83, BUN 21, Potassium 3.5, Sodium 139, GFR 73 A complete set of results can be found in Results Review.   Recommendations:  No changes.   Follow-up plan: ICM clinic phone appointment on 09/05/2023.   91 day device clinic remote transmission 09/06/2023.    EP/Cardiology Office Visits: 10/21/2023 with Dr [name].  Recall 06/01/2023 with Dr [name].   Copy of ICM check sent to Dr. [name].  3 month ICM trend: 08/01/2023.    12-14 Month ICM trend:     [name], RN 08/04/2023 12:15 PM

## 2023-08-23 ENCOUNTER — Other Ambulatory Visit: Payer: Self-pay | Admitting: Cardiology

## 2023-08-23 DIAGNOSIS — I480 Paroxysmal atrial fibrillation: Secondary | ICD-10-CM

## 2023-08-23 NOTE — Telephone Encounter (Signed)
Prescription refill request for Eliquis received. Indication: PAF Last office visit: 04/15/23  Dominga Ferry MD Scr: 0.78 on 01/26/23  [facility] Age: 78 Weight: 63.7kg  Based on above findings Eliquis 5mg  twice daily is the appropriate dose.  Refill approved.

## 2023-09-05 ENCOUNTER — Ambulatory Visit: Payer: Medicare Other | Attending: Cardiovascular Disease

## 2023-09-05 DIAGNOSIS — I5032 Chronic diastolic (congestive) heart failure: Secondary | ICD-10-CM | POA: Diagnosis not present

## 2023-09-05 DIAGNOSIS — Z95810 Presence of automatic (implantable) cardiac defibrillator: Secondary | ICD-10-CM

## 2023-09-06 ENCOUNTER — Ambulatory Visit (INDEPENDENT_AMBULATORY_CARE_PROVIDER_SITE_OTHER): Payer: Medicare Other

## 2023-09-06 DIAGNOSIS — I5032 Chronic diastolic (congestive) heart failure: Secondary | ICD-10-CM

## 2023-09-06 DIAGNOSIS — I428 Other cardiomyopathies: Secondary | ICD-10-CM

## 2023-09-09 NOTE — Progress Notes (Signed)
EPIC Encounter for ICM Monitoring  Patient Name: Kathleen Edwards is a 78 y.o. female Date: 09/09/2023 Primary Care Physican: Ignatius Specking, MD Primary Cardiologist: Branch Electrophysiologist:  [name]: 99% 04/15/2023 Office Weight: 140 lbs   AT/AF Burden: <1% (taking Eliquis)                                                            Transmission results reviewed.     CorVue thoracic impedance suggesting normal fluid levels with the exception of possible fluid accumulation from 1/25-1/29.   Prescribed: Furosemide 20 mg take 1 tablet daily. Potassium 10 mEq take 1 tablet daily   Labs: 07/17/2022 Creatinine 0.83, BUN 21, Potassium 3.5, Sodium 139, GFR 73 A complete set of results can be found in Results Review.   Recommendations:  No changes.   Follow-up plan: ICM clinic phone appointment on 10/10/2023.   91 day device clinic remote transmission 12/06/2023.    EP/Cardiology Office Visits: 10/21/2023 with Dr Wyline Mood.  Recall 06/01/2023 with Dr Nelly Laurence.   Copy of ICM check sent to Dr. Nelly Laurence.  3 month ICM trend: 09/05/2023.    12-14 Month ICM trend:     Karie Soda, RN 09/09/2023 10:00 AM

## 2023-09-13 LAB — CUP PACEART REMOTE DEVICE CHECK
Battery Remaining Longevity: 35 mo
Battery Remaining Percentage: 37 %
Battery Voltage: 2.93 V
Brady Statistic AP VP Percent: 16 %
Brady Statistic AP VS Percent: 1 %
Brady Statistic AS VP Percent: 84 %
Brady Statistic AS VS Percent: 1 %
Brady Statistic RA Percent Paced: 15 %
Date Time Interrogation Session: 20250210020038
HighPow Impedance: 62 Ohm
HighPow Impedance: 62 Ohm
Lead Channel Impedance Value: 1075 Ohm
Lead Channel Impedance Value: 380 Ohm
Lead Channel Impedance Value: 480 Ohm
Lead Channel Pacing Threshold Amplitude: 0.625 V
Lead Channel Pacing Threshold Amplitude: 0.875 V
Lead Channel Pacing Threshold Amplitude: 1.5 V
Lead Channel Pacing Threshold Pulse Width: 0.4 ms
Lead Channel Pacing Threshold Pulse Width: 0.5 ms
Lead Channel Pacing Threshold Pulse Width: 0.7 ms
Lead Channel Sensing Intrinsic Amplitude: 1 mV
Lead Channel Sensing Intrinsic Amplitude: 8.9 mV
Lead Channel Setting Pacing Amplitude: 1.875
Lead Channel Setting Pacing Amplitude: 2 V
Lead Channel Setting Pacing Amplitude: 2 V
Lead Channel Setting Pacing Pulse Width: 0.4 ms
Lead Channel Setting Pacing Pulse Width: 0.7 ms
Lead Channel Setting Sensing Sensitivity: 0.5 mV
Pulse Gen Serial Number: 9880584
Zone Setting Status: 755011

## 2023-09-21 ENCOUNTER — Other Ambulatory Visit: Payer: Self-pay | Admitting: Cardiology

## 2023-10-10 ENCOUNTER — Ambulatory Visit: Payer: Medicare Other | Attending: Cardiovascular Disease

## 2023-10-10 DIAGNOSIS — Z95810 Presence of automatic (implantable) cardiac defibrillator: Secondary | ICD-10-CM

## 2023-10-10 DIAGNOSIS — I5022 Chronic systolic (congestive) heart failure: Secondary | ICD-10-CM

## 2023-10-10 NOTE — Progress Notes (Signed)
 EPIC Encounter for ICM Monitoring  Patient Name: Kathleen Edwards is a 78 y.o. female Date: 10/10/2023 Primary Care Physican: Ignatius Specking, MD Primary Cardiologist: Branch Electrophysiologist:  [name]: 99% 04/15/2023 Office Weight: 140 lbs 10/10/2023 Weight: 136 lbs   AT/AF Burden: <1% (taking Eliquis)                                                            Spoke with patient and heart failure questions reviewed.  Transmission results reviewed.  Pt asymptomatic for fluid accumulation.  Reports feeling well at this time and voices no complaints.  She did eat some salty popcorn yesterday which may have contributed to possible fluid accumulation.    CorVue thoracic impedance suggesting possible fluid accumulation starting 3/16.   Prescribed: Furosemide 20 mg take 1 tablet daily. Potassium 10 mEq take 1 tablet daily   Labs: 07/17/2022 Creatinine 0.83, BUN 21, Potassium 3.5, Sodium 139, GFR 73 A complete set of results can be found in Results Review.   Recommendations:  She will cut back on salt intake and recheck fluid levels next week.   Follow-up plan: ICM clinic phone appointment on 10/17/2023 to recheck fluid levels.   91 day device clinic remote transmission 12/06/2023.    EP/Cardiology Office Visits: 10/21/2023 with Dr Wyline Mood.  Recall 06/01/2023 with Dr Nelly Laurence.   Copy of ICM check sent to Dr. Nelly Laurence.  3 month ICM trend: 10/10/2023.    12-14 Month ICM trend:     Karie Soda, RN 10/10/2023 3:48 PM

## 2023-10-17 ENCOUNTER — Ambulatory Visit: Attending: Cardiovascular Disease

## 2023-10-17 DIAGNOSIS — Z95810 Presence of automatic (implantable) cardiac defibrillator: Secondary | ICD-10-CM

## 2023-10-17 DIAGNOSIS — I5022 Chronic systolic (congestive) heart failure: Secondary | ICD-10-CM

## 2023-10-18 NOTE — Addendum Note (Signed)
 Addended by: Geralyn Flash D on: 10/18/2023 11:28 AM   Modules accepted: Orders

## 2023-10-18 NOTE — Progress Notes (Signed)
 EPIC Encounter for ICM Monitoring  Patient Name: Kathleen Edwards is a 78 y.o. female Date: 10/18/2023 Primary Care Physican: Ignatius Specking, MD Primary Cardiologist: Branch Electrophysiologist:  [name]: 99% 04/15/2023 Office Weight: 140 lbs 10/10/2023 Weight: 136 lbs 10/18/2023 Weight: 136 lbs   AT/AF Burden: <1% (taking Eliquis)                                                           Spoke with patient and heart failure questions reviewed.  Transmission results reviewed.  Pt asymptomatic for fluid accumulation.  She is feeling well.    CorVue thoracic impedance suggesting fluid levels returned to normal 3/18.   Prescribed: Furosemide 20 mg take 1 tablet daily. Potassium 10 mEq take 1 tablet daily   Labs: 07/17/2022 Creatinine 0.83, BUN 21, Potassium 3.5, Sodium 139, GFR 73 A complete set of results can be found in Results Review.   Recommendations:  No changes and encouraged to call if experiencing any fluid symptoms.   Follow-up plan: ICM clinic phone appointment on 11/14/2023.   91 day device clinic remote transmission 12/06/2023.    EP/Cardiology Office Visits: 10/21/2023 with Dr Wyline Mood.  Recall 06/01/2023 with Dr Nelly Laurence.   Copy of ICM check sent to Dr. Nelly Laurence.  3 month ICM trend: 10/17/2023.    12-14 Month ICM trend:     Karie Soda, RN 10/18/2023 7:53 AM

## 2023-10-18 NOTE — Progress Notes (Signed)
 Remote ICD transmission.

## 2023-10-21 ENCOUNTER — Encounter: Payer: Self-pay | Admitting: Cardiology

## 2023-10-21 ENCOUNTER — Ambulatory Visit: Payer: Medicare Other | Attending: Cardiology | Admitting: Cardiology

## 2023-10-21 VITALS — BP 120/60 | HR 67 | Ht 63.5 in | Wt 137.6 lb

## 2023-10-21 DIAGNOSIS — E782 Mixed hyperlipidemia: Secondary | ICD-10-CM

## 2023-10-21 DIAGNOSIS — I5032 Chronic diastolic (congestive) heart failure: Secondary | ICD-10-CM

## 2023-10-21 DIAGNOSIS — I2510 Atherosclerotic heart disease of native coronary artery without angina pectoris: Secondary | ICD-10-CM

## 2023-10-21 DIAGNOSIS — I480 Paroxysmal atrial fibrillation: Secondary | ICD-10-CM

## 2023-10-21 NOTE — Patient Instructions (Signed)
 Medication Instructions:  Continue all current medications.   Labwork: none  Testing/Procedures: none  Follow-Up: 6 months   Any Other Special Instructions Will Be Listed Below (If Applicable).   If you need a refill on your cardiac medications before your next appointment, please call your pharmacy.

## 2023-10-21 NOTE — Progress Notes (Signed)
 Clinical Summary Kathleen Edwards is a 78 y.o.female seen today for follow up fo the following medical problems.    1. HFimpEF - admitted 01/17/18 with new diagnosis of systolic HF.  - from notes presented with hypertensive emergency, started on nitro gtt and diuresed - EKG LBBB,  - 12/2017 echo [facility]: LVEF 20%  - 12/2017 cath with mild to moderate CAD. CI 3.2, PCWP 10, LVEDP 14. Overall normal CO and filling pressures   Jan 2020 echo: LVEF 20-25%.    08/2018 BiV AICD placed, followed by EP 03/2019 echo: LVEF 55-60%,   - no SOB/DOE, no recent edema - compliant with meds - 08/2023 device check normal function, <1%     2. PAF -  noted device checks 11/2018 and 02/2019 - CHADS2Vasc score is 5 (CHF, HTN, age x1, CAD,gender)   -no palpitatoins - no bleeding on eliquis.    3. CAD - 12/2017 cath as reported below, overall mild to moderate disease  -03/2022 nuclear stress: inferolateral iscehmia, mild and low risk - started on imdur 15 mg daily at prior visit for chest apsins   - denies any chest pains.    4. Hyperlipidmeia -upcoming labs with pcp next month - 08/2021 TC 197 HDL 70 TG 80 LDL 109. Based on this pravastatin was increased 80mg   - 12/2021 TC 154 TG 90 HDL 56 LDL 81 - 01/2023 TC 528 TG 93 HDL 62 LDL 102   - last visit changed pravastatin to atorvastatin 40mg  daily.    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   Previously retired at [employer].  Past Medical History:  Diagnosis Date   Acquired absence of both cervix and uterus    Anxiety disorder    Asthma    Degenerative joint disease of hand    Diverticulosis    Fatigue    H/O: osteoarthritis    History of vitamin A deficiency    Hyperlipidemia    Hypertension    Keratosis, seborrheic    Lipoma    Ovarian failure    Rhinitis, allergic    Vitamin D deficiency      Allergies  Allergen Reactions   Lisinopril Cough   Vioxx [Rofecoxib] Other (See Comments)    Epigastric  discomfort      Current Outpatient Medications  Medication Sig Dispense Refill   amoxicillin-clavulanate (AUGMENTIN) 500-125 MG tablet Take 1 tablet by mouth 2 (two) times daily.     apixaban (ELIQUIS) 5 MG TABS tablet TAKE 1 TABLET BY MOUTH TWICE DAILY 180 tablet 1   atorvastatin (LIPITOR) 40 MG tablet Take 1 tablet (40 mg total) by mouth daily. 30 tablet 6   Cholecalciferol (VITAMIN D PO) Take 1 capsule by mouth daily.     ENTRESTO 97-103 MG TAKE 1 TABLET BY MOUTH TWICE DAILY 180 tablet 1   famotidine (PEPCID) 20 MG tablet Take by mouth.     furosemide (LASIX) 20 MG tablet TAKE 1 TABLET BY MOUTH DAILY 90 tablet 1   isosorbide mononitrate (IMDUR) 30 MG 24 hr tablet TAKE 1/2 TABLET BY MOUTH DAILY 45 tablet 1   metoprolol succinate (TOPROL-XL) 25 MG 24 hr tablet TAKE 1 AND 1/2 TABLETS BY MOUTH DAILY 135 tablet 3   potassium chloride (K-DUR) 10 MEQ tablet Take 10 mEq by mouth daily.     No current facility-administered medications for this visit.     Past Surgical History:  Procedure Laterality Date  BIV ICD INSERTION CRT-D N/A 09/12/2018   Procedure: BIV ICD INSERTION CRT-D;  Surgeon: Hillis Range, MD;  Location: [facility] INVASIVE CV LAB;  Service: Cardiovascular;  Laterality: N/A;   RIGHT/LEFT HEART CATH AND CORONARY ANGIOGRAPHY N/A 02/15/2018   Procedure: RIGHT/LEFT HEART CATH AND CORONARY ANGIOGRAPHY;  Surgeon: Lennette Bihari, MD;  Location: [facility] INVASIVE CV LAB;  Service: Cardiovascular;  Laterality: N/A;   TOTAL ABDOMINAL HYSTERECTOMY  1979     Allergies  Allergen Reactions   Lisinopril Cough   Vioxx [Rofecoxib] Other (See Comments)    Epigastric discomfort       Family History  Problem Relation Age of Onset   CVA Mother    Brain cancer Father      Social History Ms. Ariola reports that she quit smoking about 25 years ago. Her smoking use included cigarettes. She has never used smokeless tobacco. Ms. Subia reports that she does not currently use  alcohol.     Physical Examination Today's Vitals   10/21/23 0831  BP: 120/60  Pulse: 67  SpO2: 98%  Weight: 137 lb 9.6 oz (62.4 kg)  Height: 5' 3.5" (1.613 m)   Body mass index is 23.99 kg/m.  Gen: resting comfortably, no acute distress HEENT: no scleral icterus, pupils equal round and reactive, no palptable cervical adenopathy,  CV: RRR,no mrg, no jvd Resp: Clear to auscultation bilaterally GI: abdomen is soft, non-tender, non-distended, normal bowel sounds, no hepatosplenomegaly MSK: extremities are warm, no edema.  Skin: warm, no rash Neuro:  no focal deficits Psych: appropriate affect   Diagnostic Studies  12/2017 cath Dist RCA lesion is 55% stenosed. Mid LM to Dist LM lesion is 20% stenosed. Ost Cx lesion is 20% stenosed. Prox LAD lesion is 30% stenosed. Prox RCA lesion is 20% stenosed. Mid RCA lesion is 30% stenosed.   There is evidence for diffuse coronary calcification involving all coronary arteries.   Left main stenosis with distal 20% narrowing; proximal irregularity of the LAD with 30% proximal stenosis; 20% ostial stenosis of the left circumflex vessel; and very large dominant RCA with mild to moderate calcification and irregularity with 20% proximal 30% mid and 50 to 60% smooth stenosis immediately proximal to the takeoff of a large PDA and PLA vessel with 20% PLA narrowing.   RECOMMENDATION: Guideline directed medical therapy for the patient's cardiomyopathy which most likely is of nonischemic etiology.     Jan 2020 echo Study Conclusions   - Procedure narrative: Transthoracic echocardiography. Image   quality was fair. The study was technically difficult, as a   result of poor acoustic windows and body habitus. - Left ventricle: The cavity size was normal. Wall thickness was   normal. Systolic function was severely reduced. The estimated   ejection fraction was in the range of 20% to 25%. Diffuse   hypokinesis. Doppler parameters are consistent  with abnormal left   ventricular relaxation (grade 1 diastolic dysfunction). - Ventricular septum: Septal motion showed abnormal function and   dyssynergy. These changes are consistent with a left bundle   Keiyana Stehr block. - Mitral valve: Mildly to moderately calcified annulus. There was   mild regurgitation. - Left atrium: The atrium was mildly dilated. - Atrial septum: No defect or patent foramen ovale was identified.   03/2022 nuclear stress   Findings are consistent with ischemia. The study is low risk.   No ST deviation was noted.   LV perfusion is abnormal. There is evidence of ischemia. Defect 1: There is a small defect with  mild reduction in uptake present in the mid to basal inferolateral location(s) that is reversible. There is normal wall motion in the defect area. Consistent with ischemia.   Left ventricular function is normal. End diastolic cavity size is normal. End systolic cavity size is normal.   Reversible perfusion defect in basal to mid inferolateral wall consistent with ischemia Low risk study.  Small area of ischemia   Assessment and Plan   1 HFimpEF - LVEF has normalized -no recent symptoms, continue current meds   2. PAF/acquired thrombophilia - no recent symptoms - EKG today shows A sensed V paced at normal rates - continue current meds including eliquis for stroke prevention   3. CAD --recent low risk stress test, perhaps very mild inferolateral ischemia. Prior symptoms resolved with imdur - no recent symptoms, continue current meds     4. Hyperlipidemia -continue current meds, upcoming labs with pcp  F/u 6 months     Antoine Poche, M.D.

## 2023-11-14 ENCOUNTER — Ambulatory Visit: Attending: Cardiovascular Disease

## 2023-11-14 DIAGNOSIS — Z95810 Presence of automatic (implantable) cardiac defibrillator: Secondary | ICD-10-CM | POA: Diagnosis not present

## 2023-11-14 DIAGNOSIS — I5022 Chronic systolic (congestive) heart failure: Secondary | ICD-10-CM | POA: Diagnosis not present

## 2023-11-16 NOTE — Progress Notes (Signed)
 EPIC Encounter for ICM Monitoring  Patient Name: Kathleen Edwards is a 78 y.o. female Date: 11/16/2023 Primary Care Physican: Orlena Bitters, MD Primary Cardiologist: Branch Electrophysiologist:  [name]: 99% 04/15/2023 Office Weight: 140 lbs 10/10/2023 Weight: 136 lbs 10/18/2023 Weight: 136 lbs   AT/AF Burden: <1% (taking Eliquis )                                                           Spoke with patient and heart failure questions reviewed.  Transmission results reviewed.  Pt reports left ankle has been swelling in the last week and she took extra Lasix  for a couple of days and it has returned close to baseline (no swelling on right side).    CorVue thoracic impedance suggesting normal fluid levels with the exception of possible fluid accumulation from 4/11-4/14.  Possible dryness on 4/21 in response to taking extra Lasix  for a couple of days.   Prescribed: Furosemide  20 mg take 1 tablet daily. Potassium 10 mEq take 1 tablet daily   Labs: 07/17/2022 Creatinine 0.83, BUN 21, Potassium 3.5, Sodium 139, GFR 73 A complete set of results can be found in Results Review.   Recommendations:  Advised if ankle swelling returns or gets worse to call either Dr Junita Oliva office or PCP.  No changes and encouraged to call if experiencing any fluid symptoms.   Follow-up plan: ICM clinic phone appointment on 12/20/2023.   91 day device clinic remote transmission 12/06/2023.    EP/Cardiology Office Visits: 04/24/2024 with Dr Amanda Jungling.  Recall 06/01/2023 with Dr Arlester Ladd.   Copy of ICM check sent to Dr. Arlester Ladd.  3 month ICM trend: 11/14/2023.    12-14 Month ICM trend:     Almyra Jain, RN 11/16/2023 12:44 PM

## 2023-11-20 ENCOUNTER — Other Ambulatory Visit: Payer: Self-pay | Admitting: Cardiology

## 2023-12-06 ENCOUNTER — Ambulatory Visit (INDEPENDENT_AMBULATORY_CARE_PROVIDER_SITE_OTHER): Payer: Medicare Other

## 2023-12-06 DIAGNOSIS — I428 Other cardiomyopathies: Secondary | ICD-10-CM

## 2023-12-07 LAB — CUP PACEART REMOTE DEVICE CHECK
Battery Remaining Longevity: 32 mo
Battery Remaining Percentage: 35 %
Battery Voltage: 2.92 V
Brady Statistic AP VP Percent: 15 %
Brady Statistic AP VS Percent: 1 %
Brady Statistic AS VP Percent: 84 %
Brady Statistic AS VS Percent: 1 %
Brady Statistic RA Percent Paced: 15 %
Date Time Interrogation Session: 20250513020016
HighPow Impedance: 69 Ohm
HighPow Impedance: 69 Ohm
Lead Channel Impedance Value: 1075 Ohm
Lead Channel Impedance Value: 380 Ohm
Lead Channel Impedance Value: 490 Ohm
Lead Channel Pacing Threshold Amplitude: 0.625 V
Lead Channel Pacing Threshold Amplitude: 0.75 V
Lead Channel Pacing Threshold Amplitude: 1.5 V
Lead Channel Pacing Threshold Pulse Width: 0.4 ms
Lead Channel Pacing Threshold Pulse Width: 0.5 ms
Lead Channel Pacing Threshold Pulse Width: 0.7 ms
Lead Channel Sensing Intrinsic Amplitude: 1.5 mV
Lead Channel Sensing Intrinsic Amplitude: 5.6 mV
Lead Channel Setting Pacing Amplitude: 1.75 V
Lead Channel Setting Pacing Amplitude: 2 V
Lead Channel Setting Pacing Amplitude: 2 V
Lead Channel Setting Pacing Pulse Width: 0.4 ms
Lead Channel Setting Pacing Pulse Width: 0.7 ms
Lead Channel Setting Sensing Sensitivity: 0.5 mV
Pulse Gen Serial Number: 9880584
Zone Setting Status: 755011

## 2023-12-09 ENCOUNTER — Ambulatory Visit: Payer: Self-pay | Admitting: Cardiovascular Disease

## 2023-12-20 ENCOUNTER — Ambulatory Visit: Attending: Cardiovascular Disease

## 2023-12-20 DIAGNOSIS — I5022 Chronic systolic (congestive) heart failure: Secondary | ICD-10-CM | POA: Diagnosis not present

## 2023-12-20 DIAGNOSIS — Z95810 Presence of automatic (implantable) cardiac defibrillator: Secondary | ICD-10-CM

## 2023-12-23 NOTE — Progress Notes (Signed)
 EPIC Encounter for ICM Monitoring  Patient Name: Kathleen Edwards is a 78 y.o. female Date: 12/23/2023 Primary Care Physican: Orlena Bitters, MD Primary Cardiologist: Branch Electrophysiologist:  [name]: 99% 04/15/2023 Office Weight: 140 lbs 10/10/2023 Weight: 136 lbs 10/18/2023 Weight: 136 lbs   AT/AF Burden: <1% (taking Eliquis )                                                           Spoke with patient and heart failure questions reviewed.  Transmission results reviewed.  Pt reports she is doing well and has no fluid symptoms at this time.    CorVue thoracic impedance suggesting normal fluid levels with the exception of possible fluid accumulation from 4/24-5/4 and 5/20-5/25.     Prescribed: Furosemide  20 mg take 1 tablet daily. Potassium 10 mEq take 1 tablet daily   Labs: 07/17/2022 Creatinine 0.83, BUN 21, Potassium 3.5, Sodium 139, GFR 73 A complete set of results can be found in Results Review.   Recommendations:  No changes and encouraged to call if experiencing any fluid symptoms.   Follow-up plan: ICM clinic phone appointment on 02/06/2024.   91 day device clinic remote transmission 03/06/2024.    EP/Cardiology Office Visits: 04/24/2024 with Dr Amanda Jungling.   Advised to call the office to schedule appt with Dr Arlester Ladd.   Recall 06/01/2023 with Dr Arlester Ladd.   Copy of ICM check sent to Dr. Arlester Ladd.   3 month ICM trend: 12/20/2023.    12-14 Month ICM trend:     Almyra Jain, RN 12/23/2023 4:45 PM

## 2023-12-30 ENCOUNTER — Ambulatory Visit: Attending: Cardiovascular Disease | Admitting: Cardiovascular Disease

## 2023-12-30 ENCOUNTER — Ambulatory Visit: Payer: Self-pay | Admitting: Cardiovascular Disease

## 2023-12-30 ENCOUNTER — Encounter: Payer: Self-pay | Admitting: Cardiovascular Disease

## 2023-12-30 VITALS — BP 122/62 | HR 84 | Ht 63.5 in | Wt 135.6 lb

## 2023-12-30 DIAGNOSIS — I480 Paroxysmal atrial fibrillation: Secondary | ICD-10-CM

## 2023-12-30 DIAGNOSIS — I5022 Chronic systolic (congestive) heart failure: Secondary | ICD-10-CM

## 2023-12-30 LAB — CUP PACEART INCLINIC DEVICE CHECK
Battery Remaining Longevity: 31 mo
Brady Statistic RA Percent Paced: 15 %
Brady Statistic RV Percent Paced: 99 %
Date Time Interrogation Session: 20250606144554
HighPow Impedance: 58.5 Ohm
Implantable Lead Connection Status: 753985
Implantable Lead Connection Status: 753985
Implantable Lead Connection Status: 753985
Implantable Lead Implant Date: 20200218
Implantable Lead Implant Date: 20200218
Implantable Lead Implant Date: 20200218
Implantable Lead Location: 753858
Implantable Lead Location: 753859
Implantable Lead Location: 753860
Implantable Pulse Generator Implant Date: 20200218
Lead Channel Impedance Value: 1075 Ohm
Lead Channel Impedance Value: 362.5 Ohm
Lead Channel Impedance Value: 487.5 Ohm
Lead Channel Pacing Threshold Amplitude: 0.75 V
Lead Channel Pacing Threshold Amplitude: 0.75 V
Lead Channel Pacing Threshold Amplitude: 0.75 V
Lead Channel Pacing Threshold Amplitude: 0.75 V
Lead Channel Pacing Threshold Amplitude: 1.25 V
Lead Channel Pacing Threshold Amplitude: 1.25 V
Lead Channel Pacing Threshold Pulse Width: 0.4 ms
Lead Channel Pacing Threshold Pulse Width: 0.4 ms
Lead Channel Pacing Threshold Pulse Width: 0.5 ms
Lead Channel Pacing Threshold Pulse Width: 0.5 ms
Lead Channel Pacing Threshold Pulse Width: 0.7 ms
Lead Channel Pacing Threshold Pulse Width: 0.7 ms
Lead Channel Sensing Intrinsic Amplitude: 1.2 mV
Lead Channel Sensing Intrinsic Amplitude: 6.8 mV
Lead Channel Setting Pacing Amplitude: 1.75 V
Lead Channel Setting Pacing Amplitude: 2 V
Lead Channel Setting Pacing Amplitude: 2 V
Lead Channel Setting Pacing Pulse Width: 0.4 ms
Lead Channel Setting Pacing Pulse Width: 0.7 ms
Lead Channel Setting Sensing Sensitivity: 0.5 mV
Pulse Gen Serial Number: 9880584
Zone Setting Status: 755011

## 2023-12-30 NOTE — Patient Instructions (Signed)

## 2023-12-30 NOTE — Progress Notes (Signed)
   PCP: Orlena Bitters, MD Primary Cardiologist: Dr Amanda Jungling Primary EP: Dr Arlester Ladd  Kathleen Edwards is a 78 y.o. female who presents today for routine electrophysiology followup.  Since last being seen in our clinic, the patient reports doing very well.  Today, she denies symptoms of palpitations, chest pain, shortness of breath,  lower extremity edema, dizziness, presyncope, syncope, or ICD shocks.  The patient is otherwise without complaint today. she has no device related complaints -- no new tenderness, drainage, redness.   Physical Exam: Vitals:   12/30/23 0856  BP: 122/62  Pulse: 84  SpO2: 97%  Weight: 135 lb 9.6 oz (61.5 kg)  Height: 5' 3.5" (1.613 m)      The device site is normal -- no tenderness, edema, drainage, redness, threatened erosion.   ICD interrogation- reviewed in detail today,  See PACEART report       Wt Readings from Last 3 Encounters:  12/30/23 135 lb 9.6 oz (61.5 kg)  10/21/23 137 lb 9.6 oz (62.4 kg)  04/15/23 140 lb 6.4 oz (63.7 kg)    Assessment and Plan:  1.  Chronic systolic dysfunction/ nonischemic CM euvolemic today Normal BiV ICD function EF has recovered with CRT See Pace Art report No changes today she is not device dependant today followed in ICM device clinic  2. Paroxysmal atrial fibrillation Burden remains <1% Chads2vasc score is 4.  She is on eliquis  5 BID  Return in a year  Efraim Grange, MD  12/30/2023 9:13 AM

## 2024-01-19 ENCOUNTER — Other Ambulatory Visit: Payer: Self-pay | Admitting: Cardiology

## 2024-01-23 NOTE — Progress Notes (Signed)
 Remote ICD transmission.

## 2024-01-23 NOTE — Addendum Note (Signed)
 Addended by: VICCI SELLER A on: 01/23/2024 11:27 AM   Modules accepted: Orders

## 2024-02-06 ENCOUNTER — Ambulatory Visit: Attending: Cardiovascular Disease

## 2024-02-06 DIAGNOSIS — Z95810 Presence of automatic (implantable) cardiac defibrillator: Secondary | ICD-10-CM | POA: Diagnosis not present

## 2024-02-06 DIAGNOSIS — I5022 Chronic systolic (congestive) heart failure: Secondary | ICD-10-CM

## 2024-02-07 DIAGNOSIS — Z299 Encounter for prophylactic measures, unspecified: Secondary | ICD-10-CM | POA: Diagnosis not present

## 2024-02-07 DIAGNOSIS — Z0000 Encounter for general adult medical examination without abnormal findings: Secondary | ICD-10-CM | POA: Diagnosis not present

## 2024-02-07 DIAGNOSIS — Z7189 Other specified counseling: Secondary | ICD-10-CM | POA: Diagnosis not present

## 2024-02-07 DIAGNOSIS — R5383 Other fatigue: Secondary | ICD-10-CM | POA: Diagnosis not present

## 2024-02-07 DIAGNOSIS — E7800 Pure hypercholesterolemia, unspecified: Secondary | ICD-10-CM | POA: Diagnosis not present

## 2024-02-07 DIAGNOSIS — M8580 Other specified disorders of bone density and structure, unspecified site: Secondary | ICD-10-CM | POA: Diagnosis not present

## 2024-02-07 DIAGNOSIS — Z79899 Other long term (current) drug therapy: Secondary | ICD-10-CM | POA: Diagnosis not present

## 2024-02-07 DIAGNOSIS — E559 Vitamin D deficiency, unspecified: Secondary | ICD-10-CM | POA: Diagnosis not present

## 2024-02-07 DIAGNOSIS — I10 Essential (primary) hypertension: Secondary | ICD-10-CM | POA: Diagnosis not present

## 2024-02-07 DIAGNOSIS — Z1389 Encounter for screening for other disorder: Secondary | ICD-10-CM | POA: Diagnosis not present

## 2024-02-10 NOTE — Progress Notes (Signed)
 EPIC Encounter for ICM Monitoring  Patient Name: Kathleen Edwards is a 78 y.o. female Date: 02/10/2024 Primary Care Physican: [name], MD Primary Cardiologist: Branch Electrophysiologist:  [name]: 98% 04/15/2023 Office Weight: 140 lbs 10/10/2023 Weight: 136 lbs 10/18/2023 Weight: 136 lbs 12/30/2023 Office Weight: 135 lbs   AT/AF Burden: 1.6% (taking Eliquis )                                                           Spoke with patient and heart failure questions reviewed.  Transmission results reviewed.  Pt reports she is doing well and has no fluid symptoms at this time.    CorVue thoracic impedance suggesting normal fluid levels within the last month.     Prescribed: Furosemide  20 mg take 1 tablet daily. Potassium 10 mEq take 1 tablet daily   Labs: 07/17/2022 Creatinine 0.83, BUN 21, Potassium 3.5, Sodium 139, GFR 73 A complete set of results can be found in Results Review.   Recommendations:  No changes and encouraged to call if experiencing any fluid symptoms.   Follow-up plan: ICM clinic phone appointment on 03/12/2024.   91 day device clinic remote transmission 03/06/2024.    EP/Cardiology Office Visits: 04/24/2024 with Dr [name].   Recall 12/24/2024 with Dr [name].   Copy of ICM check sent to Dr. [name].   3 month ICM trend: 02/06/2024.    12-14 Month ICM trend:     [name], RN 02/10/2024 5:14 PM

## 2024-02-19 ENCOUNTER — Other Ambulatory Visit: Payer: Self-pay | Admitting: Cardiology

## 2024-02-19 DIAGNOSIS — I480 Paroxysmal atrial fibrillation: Secondary | ICD-10-CM

## 2024-02-20 NOTE — Telephone Encounter (Signed)
 Prescription refill request for Eliquis  received. Indication:afib Last office visit:6/25 Scr:0.71  7/25 Age: 78 Weight:61.5  kg  Prescription refilled

## 2024-03-06 ENCOUNTER — Ambulatory Visit: Payer: Medicare Other

## 2024-03-06 DIAGNOSIS — I428 Other cardiomyopathies: Secondary | ICD-10-CM | POA: Diagnosis not present

## 2024-03-07 LAB — CUP PACEART REMOTE DEVICE CHECK
Battery Remaining Longevity: 29 mo
Battery Remaining Percentage: 32 %
Battery Voltage: 2.92 V
Brady Statistic AP VP Percent: 16 %
Brady Statistic AP VS Percent: 1 %
Brady Statistic AS VP Percent: 83 %
Brady Statistic AS VS Percent: 1 %
Brady Statistic RA Percent Paced: 15 %
Date Time Interrogation Session: 20250812023323
HighPow Impedance: 61 Ohm
HighPow Impedance: 61 Ohm
Lead Channel Impedance Value: 1125 Ohm
Lead Channel Impedance Value: 380 Ohm
Lead Channel Impedance Value: 480 Ohm
Lead Channel Pacing Threshold Amplitude: 0.75 V
Lead Channel Pacing Threshold Amplitude: 0.875 V
Lead Channel Pacing Threshold Amplitude: 1.625 V
Lead Channel Pacing Threshold Pulse Width: 0.4 ms
Lead Channel Pacing Threshold Pulse Width: 0.5 ms
Lead Channel Pacing Threshold Pulse Width: 0.7 ms
Lead Channel Sensing Intrinsic Amplitude: 1.5 mV
Lead Channel Sensing Intrinsic Amplitude: 6.6 mV
Lead Channel Setting Pacing Amplitude: 1.875
Lead Channel Setting Pacing Amplitude: 2 V
Lead Channel Setting Pacing Amplitude: 2.125
Lead Channel Setting Pacing Pulse Width: 0.4 ms
Lead Channel Setting Pacing Pulse Width: 0.7 ms
Lead Channel Setting Sensing Sensitivity: 0.5 mV
Pulse Gen Serial Number: 9880584
Zone Setting Status: 755011

## 2024-03-11 ENCOUNTER — Ambulatory Visit: Payer: Self-pay | Admitting: Cardiovascular Disease

## 2024-03-12 ENCOUNTER — Ambulatory Visit: Attending: Cardiovascular Disease

## 2024-03-12 DIAGNOSIS — I5022 Chronic systolic (congestive) heart failure: Secondary | ICD-10-CM

## 2024-03-12 DIAGNOSIS — Z95810 Presence of automatic (implantable) cardiac defibrillator: Secondary | ICD-10-CM | POA: Diagnosis not present

## 2024-03-16 NOTE — Progress Notes (Signed)
 EPIC Encounter for ICM Monitoring  Patient Name: Kathleen Edwards is a 77 y.o. female Date: 03/16/2024 Primary Care Physican: [name], MD Primary Cardiologist: Branch Electrophysiologist:  [name]: 98% 04/15/2023 Office Weight: 140 lbs 10/10/2023 Weight: 136 lbs 10/18/2023 Weight: 136 lbs 12/30/2023 Office Weight: 135 lbs 03/16/2024 Weight:  135 lbs   AT/AF Burden: 1.3% (taking Eliquis )                                                           Spoke with patient and heart failure questions reviewed.  Transmission results reviewed.  Pt reports she is doing well and has no fluid symptoms at this time.    CorVue thoracic impedance suggesting normal fluid levels with the exception of possible fluid accumulation 8/14-/17.   Prescribed: Furosemide  20 mg take 1 tablet daily. Potassium 10 mEq take 1 tablet daily   Labs: 07/17/2022 Creatinine 0.83, BUN 21, Potassium 3.5, Sodium 139, GFR 73 A complete set of results can be found in Results Review.   Recommendations:  No changes and encouraged to call if experiencing any fluid symptoms.   Follow-up plan: ICM clinic phone appointment on 04/16/2024.   91 day device clinic remote transmission 06/05/2024.    EP/Cardiology Office Visits: 04/24/2024 with Dr [name].   Recall 12/24/2024 with Dr [name].   Copy of ICM check sent to Dr. [name]. 8  3 month ICM trend: 03/12/2024.    12-14 Month ICM trend:     [name], RN 03/16/2024 4:55 PM

## 2024-03-20 ENCOUNTER — Other Ambulatory Visit: Payer: Self-pay | Admitting: Cardiology

## 2024-04-16 ENCOUNTER — Ambulatory Visit: Attending: Cardiovascular Disease

## 2024-04-16 DIAGNOSIS — I5022 Chronic systolic (congestive) heart failure: Secondary | ICD-10-CM | POA: Diagnosis not present

## 2024-04-16 DIAGNOSIS — Z95810 Presence of automatic (implantable) cardiac defibrillator: Secondary | ICD-10-CM

## 2024-04-16 NOTE — Progress Notes (Signed)
 EPIC Encounter for ICM Monitoring  Patient Name: Kathleen Edwards is a 78 y.o. female Date: 04/16/2024 Primary Care Physican: [name], MD Primary Cardiologist: Branch Electrophysiologist:  [name]: 97% 04/15/2023 Office Weight: 140 lbs 10/10/2023 Weight: 136 lbs 10/18/2023 Weight: 136 lbs 12/30/2023 Office Weight: 135 lbs 03/16/2024 Weight:  135 lbs   AT/AF Burden: 1.5% (taking Eliquis )                                                           Spoke with patient and heart failure questions reviewed.  Transmission results reviewed.  Pt reports she is doing well and has no fluid symptoms at this time.  She ate salted roasted peanuts and boiled peanuts in the last 2 days which may contribute to possible fluid accumulation.  S    CorVue thoracic impedance suggesting normal fluid levels with the exception of possible fluid accumulation starting 9/21.   Prescribed: Furosemide  20 mg take 1 tablet daily. Potassium 10 mEq take 1 tablet daily   Labs: 07/17/2022 Creatinine 0.83, BUN 21, Potassium 3.5, Sodium 139, GFR 73 A complete set of results can be found in Results Review.   Recommendations:  Advised to take 1 extra 20 mg Lasix  with 1 extra Potassium 10 mEq tomorrow morning and then return to prescribed dosage.  Copy sent to Dr [name] as [name] and review.     Follow-up plan: ICM clinic phone appointment on 04/23/2024 to recheck fluid levels.   91 day device clinic remote transmission 06/05/2024.    EP/Cardiology Office Visits: 04/24/2024 with Dr [name].   Recall 12/24/2024 with Dr [name].   Copy of ICM check sent to Dr. [name].    3 month ICM trend: 04/16/2024.    12-14 Month ICM trend:     Kathleen GORMAN Garner, RN 04/16/2024 11:27 AM

## 2024-04-17 NOTE — Progress Notes (Signed)
 [name] Dorn FALCON, MD  Machaela Caterino, Mitzie RAMAN, RN Agree with your assessment and plan  JINNY [name] MD

## 2024-04-19 NOTE — Progress Notes (Signed)
Remote ICD Transmission.

## 2024-04-23 ENCOUNTER — Ambulatory Visit: Attending: Cardiovascular Disease

## 2024-04-23 DIAGNOSIS — I5022 Chronic systolic (congestive) heart failure: Secondary | ICD-10-CM

## 2024-04-23 DIAGNOSIS — Z95810 Presence of automatic (implantable) cardiac defibrillator: Secondary | ICD-10-CM

## 2024-04-23 NOTE — Progress Notes (Signed)
 EPIC Encounter for ICM Monitoring  Patient Name: Kathleen Edwards is a 78 y.o. female Date: 04/23/2024 Primary Care Physican: [name], MD Primary Cardiologist: Branch Electrophysiologist:  [name]: 97% 04/15/2023 Office Weight: 140 lbs 10/10/2023 Weight: 136 lbs 10/18/2023 Weight: 136 lbs 12/30/2023 Office Weight: 135 lbs 03/16/2024 Weight:  135 lbs 04/23/2024 Weigh: 135 lbs   AT/AF Burden: 1.5% (taking Eliquis )                                                           Spoke with patient and heart failure questions reviewed.  Transmission results reviewed.  Pt reports she is doing well and has no fluid symptoms at this time.      Since 04/16/2024 ICM Remote Transmission, CorVue thoracic impedance suggesting possible fluid accumulation resolved 9/23 but returned on 9/27 but is trending back toward baseline.   Prescribed: Furosemide  20 mg take 1 tablet daily. Potassium 10 mEq take 1 tablet daily   Labs: 07/17/2022 Creatinine 0.83, BUN 21, Potassium 3.5, Sodium 139, GFR 73 A complete set of results can be found in Results Review.   Recommendations:  No changes and encouraged to call if experiencing any fluid symptoms.  Copy sent to Dr [name] as [name] for 9/30 OV.     Follow-up plan: ICM clinic phone appointment on 05/21/2024.   91 day device clinic remote transmission 06/05/2024.    EP/Cardiology Office Visits: 04/24/2024 with Dr [name].   Recall 12/24/2024 with Dr [name].   Copy of ICM check sent to Dr. [name].   Remote monitoring is medically necessary for Heart Failure Management.    90 day Daily Thoracic Impedance ICM trend: 01/23/2024 through 04/23/2024.    12-14 Month Thoracic Impedance ICM trend:     Kathleen GORMAN Garner, RN 04/23/2024 4:24 PM

## 2024-04-24 ENCOUNTER — Ambulatory Visit: Attending: Cardiology | Admitting: Cardiology

## 2024-04-24 ENCOUNTER — Encounter: Payer: Self-pay | Admitting: Cardiology

## 2024-04-24 VITALS — BP 134/64 | HR 86 | Ht 63.5 in | Wt 134.8 lb

## 2024-04-24 DIAGNOSIS — I2510 Atherosclerotic heart disease of native coronary artery without angina pectoris: Secondary | ICD-10-CM | POA: Diagnosis not present

## 2024-04-24 DIAGNOSIS — I480 Paroxysmal atrial fibrillation: Secondary | ICD-10-CM

## 2024-04-24 DIAGNOSIS — D6869 Other thrombophilia: Secondary | ICD-10-CM | POA: Diagnosis not present

## 2024-04-24 DIAGNOSIS — I5020 Unspecified systolic (congestive) heart failure: Secondary | ICD-10-CM

## 2024-04-24 DIAGNOSIS — E782 Mixed hyperlipidemia: Secondary | ICD-10-CM

## 2024-04-24 DIAGNOSIS — I5032 Chronic diastolic (congestive) heart failure: Secondary | ICD-10-CM

## 2024-04-24 NOTE — Patient Instructions (Signed)
 Medication Instructions:  Continue all current medications.   Labwork: none  Testing/Procedures: none  Follow-Up: 6 months   Any Other Special Instructions Will Be Listed Below (If Applicable).   If you need a refill on your cardiac medications before your next appointment, please call your pharmacy.

## 2024-04-24 NOTE — Progress Notes (Signed)
 Clinical Summary Kathleen Edwards is a 78 y.o.female seen today for follow up fo the following medical problems.    1. HFimpEF - admitted 01/17/18 with new diagnosis of systolic HF.  - from notes presented with hypertensive emergency, started on nitro gtt and diuresed - EKG LBBB,  - 12/2017 echo [facility]: LVEF 20%  - 12/2017 cath with mild to moderate CAD. CI 3.2, PCWP 10, LVEDP 14. Overall normal CO and filling pressures   Jan 2020 echo: LVEF 20-25%.    08/2018 BiV AICD placed, followed by EP Dr [name] 03/2019 echo: LVEF 55-60%,    - no SOB/DOE, no recent edema - compliant with meds   2. PAF -  noted device checks 11/2018 and 02/2019 - CHADS2Vasc score is 5 (CHF, HTN, age x1, CAD,gender)   - no recent palpitations - compliant with meds, no bleeding on eliquis .    3. CAD - 12/2017 cath as reported below, overall mild to moderate disease  -03/2022 nuclear stress: inferolateral iscehmia, mild and low risk - started on imdur  15 mg daily at prior visit for chest apsins   - no chest pains.    4. 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   Previously retired at [employer].   Past Medical History:  Diagnosis Date   Acquired absence of both cervix and uterus    Anxiety disorder    Asthma    Degenerative joint disease of hand    Diverticulosis    Fatigue    H/O: osteoarthritis    History of vitamin A deficiency    Hyperlipidemia    Hypertension    Keratosis, seborrheic    Lipoma    Ovarian failure    Rhinitis, allergic    Vitamin D deficiency      Allergies  Allergen Reactions   Lisinopril Cough   Vioxx [Rofecoxib] Other (See Comments)    Epigastric discomfort      Current Outpatient Medications  Medication Sig Dispense Refill   amoxicillin-clavulanate (AUGMENTIN) 500-125 MG tablet Take 1 tablet by mouth 2 (two) times daily.     atorvastatin  (LIPITOR) 40 MG tablet  TAKE 1 TABLET BY MOUTH DAILY (STOP Pravastatin , Medication CHANGE 9/20) 90 tablet 3   Cholecalciferol (VITAMIN D PO) Take 1 capsule by mouth daily.     ELIQUIS  5 MG TABS tablet TAKE 1 TABLET BY MOUTH TWICE DAILY 180 tablet 1   famotidine (PEPCID) 20 MG tablet Take by mouth.     furosemide  (LASIX ) 20 MG tablet TAKE 1 TABLET BY MOUTH DAILY 90 tablet 2   isosorbide  mononitrate (IMDUR ) 30 MG 24 hr tablet TAKE 1/2 TABLET BY MOUTH DAILY 45 tablet 2   metoprolol  succinate (TOPROL -XL) 25 MG 24 hr tablet TAKE 1 AND 1/2 TABLETS BY MOUTH DAILY 135 tablet 3   potassium chloride  (K-DUR) 10 MEQ tablet Take 10 mEq by mouth daily.     sacubitril -valsartan  (ENTRESTO ) 97-103 MG TAKE 1 TABLET BY MOUTH TWICE DAILY 180 tablet 3   No current facility-administered medications for this visit.     Past Surgical History:  Procedure Laterality Date   BIV ICD INSERTION CRT-D N/A 09/12/2018   Procedure: BIV ICD INSERTION CRT-D;  Surgeon: Kelsie Agent, MD;  Location: [facility] INVASIVE CV LAB;  Service: Cardiovascular;  Laterality: N/A;   RIGHT/LEFT HEART CATH AND CORONARY ANGIOGRAPHY N/A 02/15/2018   Procedure: RIGHT/LEFT  HEART CATH AND CORONARY ANGIOGRAPHY;  Surgeon: Burnard Debby LABOR, MD;  Location: [facility] INVASIVE CV LAB;  Service: Cardiovascular;  Laterality: N/A;   TOTAL ABDOMINAL HYSTERECTOMY  1979     Allergies  Allergen Reactions   Lisinopril Cough   Vioxx [Rofecoxib] Other (See Comments)    Epigastric discomfort       Family History  Problem Relation Age of Onset   CVA Mother    Brain cancer Father      Social History Ms. Stallings reports that she quit smoking about 25 years ago. Her smoking use included cigarettes. She has never used smokeless tobacco. Ms. Priego reports that she does not currently use alcohol.     Physical Examination Today's Vitals   04/24/24 0841  BP: 134/64  Pulse: 86  SpO2: 99%  Weight: 134 lb 12.8 oz (61.1 kg)  Height: 5' 3.5 (1.613 m)   Body mass index is 23.5  kg/m.  Gen: resting comfortably, no acute distress HEENT: no scleral icterus, pupils equal round and reactive, no palptable cervical adenopathy,  CV: RRR, no mrg, no jvd Resp: Clear to auscultation bilaterally GI: abdomen is soft, non-tender, non-distended, normal bowel sounds, no hepatosplenomegaly MSK: extremities are warm, no edema.  Skin: warm, no rash Neuro:  no focal deficits Psych: appropriate affect   Diagnostic Studies  12/2017 cath Dist RCA lesion is 55% stenosed. Mid LM to Dist LM lesion is 20% stenosed. Ost Cx lesion is 20% stenosed. Prox LAD lesion is 30% stenosed. Prox RCA lesion is 20% stenosed. Mid RCA lesion is 30% stenosed.   There is evidence for diffuse coronary calcification involving all coronary arteries.   Left main stenosis with distal 20% narrowing; proximal irregularity of the LAD with 30% proximal stenosis; 20% ostial stenosis of the left circumflex vessel; and very large dominant RCA with mild to moderate calcification and irregularity with 20% proximal 30% mid and 50 to 60% smooth stenosis immediately proximal to the takeoff of a large PDA and PLA vessel with 20% PLA narrowing.   RECOMMENDATION: Guideline directed medical therapy for the patient's cardiomyopathy which most likely is of nonischemic etiology.     Jan 2020 echo Study Conclusions   - Procedure narrative: Transthoracic echocardiography. Image   quality was fair. The study was technically difficult, as a   result of poor acoustic windows and body habitus. - Left ventricle: The cavity size was normal. Wall thickness was   normal. Systolic function was severely reduced. The estimated   ejection fraction was in the range of 20% to 25%. Diffuse   hypokinesis. Doppler parameters are consistent with abnormal left   ventricular relaxation (grade 1 diastolic dysfunction). - Ventricular septum: Septal motion showed abnormal function and   dyssynergy. These changes are consistent with a left  bundle   Montrice Gracey block. - Mitral valve: Mildly to moderately calcified annulus. There was   mild regurgitation. - Left atrium: The atrium was mildly dilated. - Atrial septum: No defect or patent foramen ovale was identified.   03/2022 nuclear stress   Findings are consistent with ischemia. The study is low risk.   No ST deviation was noted.   LV perfusion is abnormal. There is evidence of ischemia. Defect 1: There is a small defect with mild reduction in uptake present in the mid to basal inferolateral location(s) that is reversible. There is normal wall motion in the defect area. Consistent with ischemia.   Left ventricular function is normal. End diastolic cavity size is normal. End systolic cavity  size is normal.   Reversible perfusion defect in basal to mid inferolateral wall consistent with ischemia Low risk study.  Small area of ischemia   Assessment and Plan   1 HFimpEF - LVEF has normalized -denies symptoms, continue current meds   2. PAF/acquired thrombophilia - no recent symptoms, continue current meds   3. CAD --no symptoms, continue current meds     4. Hyperlipidemia -at goal, continue current therapy.      Dorn PHEBE Ross, M.D.

## 2024-04-26 DIAGNOSIS — I10 Essential (primary) hypertension: Secondary | ICD-10-CM | POA: Diagnosis not present

## 2024-04-26 DIAGNOSIS — Z0000 Encounter for general adult medical examination without abnormal findings: Secondary | ICD-10-CM | POA: Diagnosis not present

## 2024-04-26 DIAGNOSIS — I5020 Unspecified systolic (congestive) heart failure: Secondary | ICD-10-CM | POA: Diagnosis not present

## 2024-04-26 DIAGNOSIS — Z299 Encounter for prophylactic measures, unspecified: Secondary | ICD-10-CM | POA: Diagnosis not present

## 2024-04-26 DIAGNOSIS — Z23 Encounter for immunization: Secondary | ICD-10-CM | POA: Diagnosis not present

## 2024-05-17 ENCOUNTER — Other Ambulatory Visit: Payer: Self-pay | Admitting: Cardiology

## 2024-05-21 ENCOUNTER — Ambulatory Visit: Attending: Cardiovascular Disease

## 2024-05-21 DIAGNOSIS — I5022 Chronic systolic (congestive) heart failure: Secondary | ICD-10-CM | POA: Diagnosis not present

## 2024-05-21 DIAGNOSIS — Z95810 Presence of automatic (implantable) cardiac defibrillator: Secondary | ICD-10-CM | POA: Diagnosis not present

## 2024-05-23 NOTE — Progress Notes (Signed)
 EPIC Encounter for ICM Monitoring  Patient Name: Kathleen Edwards is a 78 y.o. female Date: 05/23/2024 Primary Care Physican: [name], MD Primary Cardiologist: Branch Electrophysiologist:  [name]: 97% 04/15/2023 Office Weight: 140 lbs 10/10/2023 Weight: 136 lbs 10/18/2023 Weight: 136 lbs 12/30/2023 Office Weight: 135 lbs 03/16/2024 Weight:  135 lbs 04/23/2024 Weight: 135 lbs   AT/AF Burden: 1.5% (taking Eliquis )                                                           Spoke with patient and heart failure questions reviewed.  Transmission results reviewed.  Pt asymptomatic for fluid accumulation.  Reports feeling well at this time and voices no complaints.      Since 04/23/2024 ICM Remote Transmission, CorVue thoracic impedance suggesting normal fluid levels with the exception of possible fluid accumulation from 05/05/2024-05/12/2024.   Prescribed: Furosemide  20 mg take 1 tablet daily. Potassium 10 mEq take 1 tablet daily   Labs: 07/17/2022 Creatinine 0.83, BUN 21, Potassium 3.5, Sodium 139, GFR 73 A complete set of results can be found in Results Review.   Recommendations:  No changes and encouraged to call if experiencing any fluid symptoms.   Follow-up plan: ICM clinic phone appointment on 06/25/2024.   91 day device clinic remote transmission 06/05/2024.    EP/Cardiology Office Visits: 09/24/2024 with Dr [name].   Recall 12/24/2024 with Dr [name].   Copy of ICM check sent to Dr. [name].   Remote monitoring is medically necessary for Heart Failure Management.    Daily Thoracic Impedance ICM trend: 02/21/2024 through 05/21/2024.    12-14 Month Thoracic Impedance ICM trend:     [name], RN 05/23/2024 10:27 AM

## 2024-06-05 ENCOUNTER — Ambulatory Visit: Payer: Medicare Other

## 2024-06-05 DIAGNOSIS — I428 Other cardiomyopathies: Secondary | ICD-10-CM | POA: Diagnosis not present

## 2024-06-06 LAB — CUP PACEART REMOTE DEVICE CHECK
Battery Remaining Longevity: 26 mo
Battery Remaining Percentage: 29 %
Battery Voltage: 2.92 V
Brady Statistic AP VP Percent: 18 %
Brady Statistic AP VS Percent: 1 %
Brady Statistic AS VP Percent: 81 %
Brady Statistic AS VS Percent: 1 %
Brady Statistic RA Percent Paced: 17 %
Date Time Interrogation Session: 20251111020015
HighPow Impedance: 61 Ohm
HighPow Impedance: 61 Ohm
Lead Channel Impedance Value: 1125 Ohm
Lead Channel Impedance Value: 340 Ohm
Lead Channel Impedance Value: 480 Ohm
Lead Channel Pacing Threshold Amplitude: 0.75 V
Lead Channel Pacing Threshold Amplitude: 0.875 V
Lead Channel Pacing Threshold Amplitude: 1.5 V
Lead Channel Pacing Threshold Pulse Width: 0.4 ms
Lead Channel Pacing Threshold Pulse Width: 0.5 ms
Lead Channel Pacing Threshold Pulse Width: 0.7 ms
Lead Channel Sensing Intrinsic Amplitude: 1.5 mV
Lead Channel Sensing Intrinsic Amplitude: 8.9 mV
Lead Channel Setting Pacing Amplitude: 1.875
Lead Channel Setting Pacing Amplitude: 2 V
Lead Channel Setting Pacing Amplitude: 2 V
Lead Channel Setting Pacing Pulse Width: 0.4 ms
Lead Channel Setting Pacing Pulse Width: 0.7 ms
Lead Channel Setting Sensing Sensitivity: 0.5 mV
Pulse Gen Serial Number: 9880584
Zone Setting Status: 755011

## 2024-06-08 NOTE — Progress Notes (Signed)
 Remote ICD Transmission

## 2024-06-25 ENCOUNTER — Ambulatory Visit: Payer: Self-pay | Admitting: Cardiovascular Disease

## 2024-06-25 ENCOUNTER — Ambulatory Visit: Attending: Cardiovascular Disease

## 2024-06-25 DIAGNOSIS — I5022 Chronic systolic (congestive) heart failure: Secondary | ICD-10-CM

## 2024-06-25 DIAGNOSIS — Z95810 Presence of automatic (implantable) cardiac defibrillator: Secondary | ICD-10-CM | POA: Diagnosis not present

## 2024-06-26 ENCOUNTER — Telehealth: Payer: Self-pay

## 2024-06-26 NOTE — Telephone Encounter (Signed)
 Remote ICM transmission received.  Attempted call to patient regarding ICM remote transmission.  Left detailed message per DPR with ICM phone number to return call for any questions, concerns or fluid symptoms.

## 2024-06-26 NOTE — Progress Notes (Signed)
 EPIC Encounter for ICM Monitoring  Patient Name: Kathleen Edwards is a 78 y.o. female Date: 06/26/2024 Primary Care Physican: [name], MD Primary Cardiologist: Branch Electrophysiologist:  [name]: 98% 04/15/2023 Office Weight: 140 lbs 10/10/2023 Weight: 136 lbs 10/18/2023 Weight: 136 lbs 12/30/2023 Office Weight: 135 lbs 03/16/2024 Weight:  135 lbs 04/23/2024 Weight: 135 lbs   AT/AF Burden: 1.4% (taking Eliquis )                                                           Attempted call to patient and unable to reach.  Left detailed message per DPR regarding transmission.  Transmission results reviewed.      Since 05/21/2024 ICM Remote Transmission, CorVue thoracic impedance suggesting normal fluid levels.   Prescribed: Furosemide  20 mg take 1 tablet daily. Potassium 10 mEq take 1 tablet daily   Labs: 07/17/2022 Creatinine 0.83, BUN 21, Potassium 3.5, Sodium 139, GFR 73 A complete set of results can be found in Results Review.   Recommendations:  Left voice mail with ICM number and encouraged to call if experiencing any fluid symptoms.   Follow-up plan: ICM clinic phone appointment on 08/06/2024.   91 day device clinic remote transmission 2/10/20265.    EP/Cardiology Office Visits: 09/24/2024 with Dr [name].   Recall 12/24/2024 with Dr [name].   Copy of ICM check sent to Dr. [name].   Remote monitoring is medically necessary for Heart Failure Management.    Daily Thoracic Impedance ICM trend: 03/27/2024 through 06/25/2024.    12-14 Month Thoracic Impedance ICM trend:     [name], RN 06/26/2024 2:59 PM

## 2024-08-06 ENCOUNTER — Ambulatory Visit: Attending: Cardiovascular Disease

## 2024-08-06 DIAGNOSIS — Z95810 Presence of automatic (implantable) cardiac defibrillator: Secondary | ICD-10-CM | POA: Diagnosis not present

## 2024-08-06 DIAGNOSIS — I5022 Chronic systolic (congestive) heart failure: Secondary | ICD-10-CM | POA: Diagnosis not present

## 2024-08-08 ENCOUNTER — Telehealth: Payer: Self-pay

## 2024-08-08 NOTE — Progress Notes (Signed)
 EPIC Encounter for ICM Monitoring  Patient Name: Kathleen Edwards is a 79 y.o. female Date: 08/08/2024 Primary Care Physican: [name], MD Primary Cardiologist: Branch Electrophysiologist:  [name]: 98% 04/15/2023 Office Weight: 140 lbs 10/10/2023 Weight: 136 lbs 10/18/2023 Weight: 136 lbs 12/30/2023 Office Weight: 135 lbs 03/16/2024 Weight:  135 lbs 04/23/2024 Weight: 135 lbs   AT/AF Burden: 1.3% (taking Eliquis )                                                           Attempted call to patient and unable to reach.  Left detailed message per DPR regarding transmission.  Transmission results reviewed.      Since 06/25/2024 ICM Remote Transmission, CorVue thoracic impedance suggesting normal fluid levels with the exception of possible fluid accumulation from 07/11/2024-07/15/2024, 07/19/2024-07/23/2024 and 07/26/2024-07/29/2024.   Prescribed: Furosemide  20 mg take 1 tablet daily. Potassium 10 mEq take 1 tablet daily   Labs: 07/17/2022 Creatinine 0.83, BUN 21, Potassium 3.5, Sodium 139, GFR 73 A complete set of results can be found in Results Review.   Recommendations:  Left voice mail with ICM number and encouraged to call if experiencing any fluid symptoms.   Follow-up plan: ICM clinic phone appointment on 09/06/2024.   91 day device clinic remote transmission 2/10/20265.    EP/Cardiology Office Visits: 09/24/2024 with Dr [name].   Recall 12/24/2024 with Dr [name].   Copy of ICM check sent to Dr. [name].     Remote monitoring is medically necessary for Heart Failure Management.    Daily Thoracic Impedance ICM trend: 05/08/2024 through 08/06/2024.    12-14 Month Thoracic Impedance ICM trend:     [name], RN 08/08/2024 1:21 PM

## 2024-08-08 NOTE — Telephone Encounter (Signed)
 Remote ICM transmission received.  Attempted call to patient regarding ICM remote transmission and no answer.

## 2024-08-23 ENCOUNTER — Other Ambulatory Visit: Payer: Self-pay | Admitting: Cardiology

## 2024-08-23 DIAGNOSIS — I480 Paroxysmal atrial fibrillation: Secondary | ICD-10-CM

## 2024-08-23 NOTE — Progress Notes (Signed)
 31 day ICM Remote transmission canceled due to [facility] clinic is on hold until further notice.  91 day remote monitoring will continue per protocol.

## 2024-09-04 ENCOUNTER — Ambulatory Visit: Payer: Medicare Other

## 2024-09-06 ENCOUNTER — Ambulatory Visit

## 2024-09-24 ENCOUNTER — Ambulatory Visit: Admitting: Cardiology

## 2024-12-04 ENCOUNTER — Ambulatory Visit

## 2025-03-05 ENCOUNTER — Ambulatory Visit

## 2025-06-04 ENCOUNTER — Ambulatory Visit

## 2025-09-03 ENCOUNTER — Ambulatory Visit

## 2025-12-03 ENCOUNTER — Ambulatory Visit
